# Patient Record
Sex: Female | Born: 1950
Health system: Southern US, Community
[De-identification: ages and names within clinical notes are randomized; demographics above are authoritative.]

## PROBLEM LIST (undated history)

## (undated) DIAGNOSIS — E119 Type 2 diabetes mellitus without complications: Secondary | ICD-10-CM

## (undated) DIAGNOSIS — Z86018 Personal history of other benign neoplasm: Secondary | ICD-10-CM

## (undated) DIAGNOSIS — E785 Hyperlipidemia, unspecified: Secondary | ICD-10-CM

## (undated) DIAGNOSIS — E039 Hypothyroidism, unspecified: Secondary | ICD-10-CM

## (undated) HISTORY — DX: Hyperlipidemia, unspecified: E78.5

## (undated) HISTORY — PX: EYE SURGERY: SHX253

## (undated) HISTORY — PX: COLONOSCOPY: SHX174

## (undated) HISTORY — DX: Personal history of other benign neoplasm: Z86.018

---

## 2005-03-09 ENCOUNTER — Ambulatory Visit: Payer: Self-pay | Admitting: Family Medicine

## 2008-01-04 ENCOUNTER — Ambulatory Visit: Payer: Self-pay | Admitting: Gastroenterology

## 2008-05-25 HISTORY — PX: OTHER SURGICAL HISTORY: SHX169

## 2010-08-22 DIAGNOSIS — Z86018 Personal history of other benign neoplasm: Secondary | ICD-10-CM | POA: Insufficient documentation

## 2010-08-22 HISTORY — DX: Personal history of other benign neoplasm: Z86.018

## 2012-02-02 ENCOUNTER — Ambulatory Visit: Payer: Self-pay | Admitting: Family Medicine

## 2014-03-02 ENCOUNTER — Ambulatory Visit: Payer: Self-pay | Admitting: Family Medicine

## 2015-03-12 ENCOUNTER — Other Ambulatory Visit: Payer: Self-pay | Admitting: Family Medicine

## 2015-03-12 DIAGNOSIS — Z1231 Encounter for screening mammogram for malignant neoplasm of breast: Secondary | ICD-10-CM

## 2015-03-27 ENCOUNTER — Ambulatory Visit
Admission: RE | Admit: 2015-03-27 | Discharge: 2015-03-27 | Disposition: A | Discharge status: Home / Self Care | Payer: BLUE CROSS/BLUE SHIELD | Source: Ambulatory Visit | Attending: Family Medicine | Admitting: Family Medicine

## 2015-03-27 DIAGNOSIS — Z1231 Encounter for screening mammogram for malignant neoplasm of breast: Secondary | ICD-10-CM | POA: Diagnosis present

## 2015-05-08 DIAGNOSIS — E039 Hypothyroidism, unspecified: Secondary | ICD-10-CM | POA: Insufficient documentation

## 2015-05-16 ENCOUNTER — Ambulatory Visit (INDEPENDENT_AMBULATORY_CARE_PROVIDER_SITE_OTHER): Payer: BLUE CROSS/BLUE SHIELD | Admitting: Family Medicine

## 2015-05-16 ENCOUNTER — Encounter: Payer: Self-pay | Admitting: Family Medicine

## 2015-05-16 VITALS — BP 112/70 | HR 60 | Temp 97.8°F | Ht 65.6 in | Wt 215.0 lb

## 2015-05-16 DIAGNOSIS — E039 Hypothyroidism, unspecified: Secondary | ICD-10-CM | POA: Diagnosis not present

## 2015-05-16 DIAGNOSIS — Z0000 Encounter for general adult medical examination without abnormal findings: Secondary | ICD-10-CM

## 2015-05-16 DIAGNOSIS — Z113 Encounter for screening for infections with a predominantly sexual mode of transmission: Secondary | ICD-10-CM

## 2015-05-16 LAB — URINALYSIS, ROUTINE W REFLEX MICROSCOPIC
BILIRUBIN UA: NEGATIVE
Glucose, UA: NEGATIVE
Ketones, UA: NEGATIVE
Leukocytes, UA: NEGATIVE
NITRITE UA: NEGATIVE
PH UA: 7 (ref 5.0–7.5)
Protein, UA: NEGATIVE
RBC UA: NEGATIVE
SPEC GRAV UA: 1.02 (ref 1.005–1.030)
Urobilinogen, Ur: 0.2 mg/dL (ref 0.2–1.0)

## 2015-05-16 MED ORDER — LEVOTHYROXINE SODIUM 50 MCG PO TABS: 50.0000 ug | ORAL_TABLET | Freq: Every day | ORAL | Status: DC

## 2015-05-16 NOTE — Assessment & Plan Note (Signed)
The current medical regimen is effective;  continue present plan and medications.  

## 2015-05-16 NOTE — Progress Notes (Signed)
   BP 112/70 mmHg  Pulse 60  Temp(Src) 97.8 F (36.6 C)  Ht 5' 5.6" (1.666 m)  Wt 215 lb (97.523 kg)  BMI 35.14 kg/m2  SpO2 98%   Subjective:    Patient ID: Sheri Arnold, female    DOB: 1950-07-11, 64 y.o.   MRN: [id number]  HPI: Sheri Arnold is a 64 y.o. female  Chief Complaint  Patient presents with  . Annual Exam    Relevant past medical, surgical, family and social history reviewed and updated as indicated. Interim medical history since our last visit reviewed. Allergies and medications reviewed and updated.  Review of Systems  Constitutional: Negative.   HENT: Negative.   Eyes: Negative.   Respiratory: Negative.   Cardiovascular: Negative.   Gastrointestinal: Negative.   Endocrine: Negative.   Genitourinary: Negative.   Musculoskeletal: Negative.   Skin: Negative.   Allergic/Immunologic: Negative.   Neurological: Negative.   Hematological: Negative.   Psychiatric/Behavioral: Negative.     Per HPI unless specifically indicated above     Objective:    BP 112/70 mmHg  Pulse 60  Temp(Src) 97.8 F (36.6 C)  Ht 5' 5.6" (1.666 m)  Wt 215 lb (97.523 kg)  BMI 35.14 kg/m2  SpO2 98%  Wt Readings from Last 3 Encounters:  05/16/15 215 lb (97.523 kg)  05/09/14 208 lb (94.348 kg)    Physical Exam  Constitutional: She is oriented to person, place, and time. She appears well-developed and well-nourished.  HENT:  Head: Normocephalic and atraumatic.  Right Ear: External ear normal.  Left Ear: External ear normal.  Nose: Nose normal.  Mouth/Throat: Oropharynx is clear and moist.  Eyes: Conjunctivae and EOM are normal. Pupils are equal, round, and reactive to light.  Neck: Normal range of motion. Neck supple. Carotid bruit is not present.  Cardiovascular: Normal rate, regular rhythm and normal heart sounds.   No murmur heard. Pulmonary/Chest: Effort normal and breath sounds normal. She exhibits no mass. Right breast exhibits no mass, no skin change  and no tenderness. Left breast exhibits no mass, no skin change and no tenderness. Breasts are symmetrical.  Abdominal: Soft. Bowel sounds are normal. There is no hepatosplenomegaly.  Musculoskeletal: Normal range of motion.  Neurological: She is alert and oriented to person, place, and time.  Skin: No rash noted.  Psychiatric: She has a normal mood and affect. Her behavior is normal. Judgment and thought content normal.    No results found for this or any previous visit.    Assessment & Plan:   Problem List Items Addressed This Visit      Endocrine   Hypothyroidism    The current medical regimen is effective;  continue present plan and medications.       Relevant Medications   levothyroxine (SYNTHROID, LEVOTHROID) 50 MCG tablet    Other Visit Diagnoses    Routine general medical examination at a health care facility    -  Primary    Relevant Orders    CBC with Differential/Platelet    Comprehensive metabolic panel    Lipid Panel w/o Chol/HDL Ratio    TSH    Urinalysis, Routine w reflex microscopic (not at [facility])    Routine screening for STI (sexually transmitted infection)        Relevant Orders    Hepatitis C Antibody        Follow up plan: Return in about 1 year (around 05/15/2016) for Physical Exam.

## 2015-05-17 ENCOUNTER — Encounter: Payer: Self-pay | Admitting: Family Medicine

## 2015-05-17 ENCOUNTER — Telehealth: Payer: Self-pay | Admitting: Family Medicine

## 2015-05-17 LAB — COMPREHENSIVE METABOLIC PANEL
A/G RATIO: 1.4 (ref 1.1–2.5)
ALBUMIN: 4.2 g/dL (ref 3.6–4.8)
ALT: 31 IU/L (ref 0–32)
AST: 20 IU/L (ref 0–40)
Alkaline Phosphatase: 86 IU/L (ref 39–117)
BUN / CREAT RATIO: 21 (ref 11–26)
BUN: 13 mg/dL (ref 8–27)
Bilirubin Total: 0.2 mg/dL (ref 0.0–1.2)
CO2: 23 mmol/L (ref 18–29)
Calcium: 9.7 mg/dL (ref 8.7–10.3)
Chloride: 100 mmol/L (ref 96–106)
Creatinine, Ser: 0.61 mg/dL (ref 0.57–1.00)
GFR calc Af Amer: 112 mL/min/{1.73_m2} (ref >59)
GFR, EST NON AFRICAN AMERICAN: 97 mL/min/{1.73_m2} (ref >59)
GLOBULIN, TOTAL: 3 g/dL (ref 1.5–4.5)
Glucose: 105 mg/dL — ABNORMAL HIGH (ref 65–99)
POTASSIUM: 4.5 mmol/L (ref 3.5–5.2)
SODIUM: 139 mmol/L (ref 134–144)
Total Protein: 7.2 g/dL (ref 6.0–8.5)

## 2015-05-17 LAB — CBC WITH DIFFERENTIAL/PLATELET
BASOS: 1 %
Basophils Absolute: 0 10*3/uL (ref 0.0–0.2)
EOS (ABSOLUTE): 0.1 10*3/uL (ref 0.0–0.4)
Eos: 2 %
HEMATOCRIT: 39.8 % (ref 34.0–46.6)
Hemoglobin: 13.1 g/dL (ref 11.1–15.9)
Immature Grans (Abs): 0 10*3/uL (ref 0.0–0.1)
Immature Granulocytes: 0 %
LYMPHS ABS: 1.4 10*3/uL (ref 0.7–3.1)
Lymphs: 24 %
MCH: 29.2 pg (ref 26.6–33.0)
MCHC: 32.9 g/dL (ref 31.5–35.7)
MCV: 89 fL (ref 79–97)
MONOS ABS: 0.5 10*3/uL (ref 0.1–0.9)
Monocytes: 9 %
NEUTROS PCT: 64 %
Neutrophils Absolute: 3.8 10*3/uL (ref 1.4–7.0)
Platelets: 283 10*3/uL (ref 150–379)
RBC: 4.49 x10E6/uL (ref 3.77–5.28)
RDW: 14 % (ref 12.3–15.4)
WBC: 5.8 10*3/uL (ref 3.4–10.8)

## 2015-05-17 LAB — LIPID PANEL W/O CHOL/HDL RATIO
CHOLESTEROL TOTAL: 288 mg/dL — AB (ref 100–199)
HDL: 66 mg/dL (ref >39)
LDL Calculated: 178 mg/dL — ABNORMAL HIGH (ref 0–99)
TRIGLYCERIDES: 221 mg/dL — AB (ref 0–149)
VLDL Cholesterol Cal: 44 mg/dL — ABNORMAL HIGH (ref 5–40)

## 2015-05-17 LAB — TSH: TSH: 2.04 u[IU]/mL (ref 0.450–4.500)

## 2015-05-17 LAB — HEPATITIS C ANTIBODY

## 2015-05-17 NOTE — Telephone Encounter (Signed)
-----   Message from Wynn Maudlin, [place] sent at 05/17/2015 11:47 AM EST ----- labs

## 2015-05-17 NOTE — Telephone Encounter (Signed)
Phone call Discussed with patient elevated cholesterol patient has tried cholesterol medications and has marked side effects myalgias doesn't want to take any cholesterol medication has had a coronary calcium score with a score of 0. Continue management with diet and exercise.

## 2015-05-22 ENCOUNTER — Other Ambulatory Visit: Payer: Self-pay | Admitting: Family Medicine

## 2015-05-22 NOTE — Telephone Encounter (Signed)
360 tabs given to patient 12/22 to [facility]- does she really need this?

## 2015-08-19 ENCOUNTER — Other Ambulatory Visit: Payer: Self-pay | Admitting: Family Medicine

## 2015-12-13 ENCOUNTER — Telehealth: Payer: Self-pay | Admitting: Family Medicine

## 2015-12-13 DIAGNOSIS — E039 Hypothyroidism, unspecified: Secondary | ICD-10-CM

## 2015-12-13 NOTE — Telephone Encounter (Signed)
Pt called and stated that she has gained weight and is losing hair and she is afraid something is going on with her thyroid and she would like to get a referral to go to endocrinology.

## 2015-12-13 NOTE — Telephone Encounter (Signed)
Referral sent 

## 2015-12-13 NOTE — Telephone Encounter (Signed)
Routing to provider  

## 2016-04-03 ENCOUNTER — Encounter: Payer: Self-pay | Admitting: Family Medicine

## 2016-05-20 ENCOUNTER — Encounter: Payer: BLUE CROSS/BLUE SHIELD | Admitting: Family Medicine

## 2016-05-23 ENCOUNTER — Other Ambulatory Visit: Payer: Self-pay | Admitting: Family Medicine

## 2016-05-23 DIAGNOSIS — E039 Hypothyroidism, unspecified: Secondary | ICD-10-CM

## 2016-05-26 NOTE — Telephone Encounter (Signed)
Last OV: 05/16/15 Next OV: 06/01/16  Lab Results  Component Value Date   TSH 2.040 05/16/2015

## 2016-06-01 ENCOUNTER — Other Ambulatory Visit: Payer: Self-pay | Admitting: Family Medicine

## 2016-06-01 ENCOUNTER — Ambulatory Visit (INDEPENDENT_AMBULATORY_CARE_PROVIDER_SITE_OTHER): Payer: BLUE CROSS/BLUE SHIELD | Admitting: Family Medicine

## 2016-06-01 ENCOUNTER — Encounter: Payer: Self-pay | Admitting: Family Medicine

## 2016-06-01 VITALS — BP 114/70 | HR 66 | Temp 97.8°F | Ht 65.35 in | Wt 208.0 lb

## 2016-06-01 DIAGNOSIS — Z1231 Encounter for screening mammogram for malignant neoplasm of breast: Secondary | ICD-10-CM

## 2016-06-01 DIAGNOSIS — Z0000 Encounter for general adult medical examination without abnormal findings: Secondary | ICD-10-CM

## 2016-06-01 DIAGNOSIS — E039 Hypothyroidism, unspecified: Secondary | ICD-10-CM | POA: Diagnosis not present

## 2016-06-01 LAB — URINALYSIS, ROUTINE W REFLEX MICROSCOPIC
BILIRUBIN UA: NEGATIVE
Glucose, UA: NEGATIVE
KETONES UA: NEGATIVE
NITRITE UA: NEGATIVE
PH UA: 7 (ref 5.0–7.5)
Protein, UA: NEGATIVE
RBC UA: NEGATIVE
SPEC GRAV UA: 1.015 (ref 1.005–1.030)
UUROB: 0.2 mg/dL (ref 0.2–1.0)

## 2016-06-01 LAB — MICROSCOPIC EXAMINATION
Bacteria, UA: NONE SEEN
Epithelial Cells (non renal): NONE SEEN /hpf (ref 0–10)
RBC, UA: NONE SEEN /hpf (ref 0–?)
WBC, UA: NONE SEEN /hpf (ref 0–?)

## 2016-06-01 MED ORDER — METFORMIN HCL ER 500 MG PO TB24: 500.0000 mg | ORAL_TABLET | Freq: Every day | ORAL | 4 refills | Status: DC

## 2016-06-01 MED ORDER — SYNTHROID 50 MCG PO TABS: 50.0000 ug | ORAL_TABLET | Freq: Every day | ORAL | 4 refills | Status: DC

## 2016-06-01 NOTE — Assessment & Plan Note (Signed)
The current medical regimen is effective;  continue present plan and medications.  

## 2016-06-01 NOTE — Progress Notes (Signed)
BP 114/70   Pulse 66   Temp 97.8 F (36.6 C) (Oral)   Ht 5' 5.35" (1.66 m)   Wt 208 lb (94.3 kg)   SpO2 99%   BMI 34.24 kg/m    Subjective:    Patient ID: Sheri Arnold, female    DOB: 04/15/1951, 66 y.o.   MRN: [id number]  HPI: Sheri Arnold is a 66 y.o. female  Chief Complaint  Patient presents with  . Annual Exam  Patient's interested in metformin. No blood sugar issues but interested in some positive effects may form and may generate. Patient wants to avoid immediate release and go to extended release to minimize GI toxicity. Patient also has been using betamethasone for some itching spot on her foot which hasn't really helped and some little patch on her left elbow. Takes thyroid without issues. 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  Relevant past medical, surgical, family and social history reviewed and updated as indicated. Interim medical history since our last visit reviewed. Allergies and medications reviewed and updated.  Review of Systems  Constitutional: Negative.   HENT: Negative.   Eyes: Negative.   Respiratory: Negative.   Cardiovascular: Negative.   Gastrointestinal: Negative.   Endocrine: Negative.   Genitourinary: Negative.   Musculoskeletal: Negative.   Skin: Negative.   Allergic/Immunologic: Negative.   Neurological: Negative.   Hematological: Negative.   Psychiatric/Behavioral: Negative.     Per HPI unless specifically indicated above     Objective:    BP 114/70   Pulse 66   Temp 97.8 F (36.6 C) (Oral)   Ht 5' 5.35" (1.66 m)   Wt 208 lb (94.3 kg)   SpO2 99%   BMI 34.24 kg/m   Wt Readings from Last 3 Encounters:  06/01/16 208 lb (94.3 kg)  05/16/15 215 lb (97.5 kg)  05/09/14 208 lb (94.3 kg)    Physical Exam  Constitutional: She is oriented to person, place, and time. She appears well-developed and well-nourished.  HENT:  Head: Normocephalic  and atraumatic.  Right Ear: External ear normal.  Left Ear: External ear normal.  Nose: Nose normal.  Mouth/Throat: Oropharynx is clear and moist.  Eyes: Conjunctivae and EOM are normal. Pupils are equal, round, and reactive to light.  Neck: Normal range of motion. Neck supple. Carotid bruit is not present.  Cardiovascular: Normal rate, regular rhythm and normal heart sounds.   No murmur heard. Pulmonary/Chest: Effort normal and breath sounds normal. She exhibits no mass. Right breast exhibits no mass, no skin change and no tenderness. Left breast exhibits no mass, no skin change and no tenderness. Breasts are symmetrical.  Abdominal: Soft. Bowel sounds are normal. There is no hepatosplenomegaly.  Musculoskeletal: Normal range of motion.  Neurological: She is alert and oriented to person, place, and time.  Skin: No rash noted.  Psychiatric: She has a normal mood and affect. Her behavior is normal. Judgment and thought content normal.    Results for orders placed or performed in visit on 05/16/15  CBC with Differential/Platelet  Result Value Ref Range   WBC 5.8 3.4 - 10.8 x10E3/uL   RBC 4.49 3.77 - 5.28 x10E6/uL   Hemoglobin 13.1 11.1 - 15.9 g/dL   Hematocrit 39.8 34.0 - 46.6 %   MCV 89 79 - 97 fL   MCH 29.2 26.6 - 33.0 pg   MCHC 32.9 31.5 - 35.7 g/dL   RDW 14.0  12.3 - 15.4 %   Platelets 283 150 - 379 x10E3/uL   Neutrophils 64 %   Lymphs 24 %   Monocytes 9 %   Eos 2 %   Basos 1 %   Neutrophils Absolute 3.8 1.4 - 7.0 x10E3/uL   Lymphocytes Absolute 1.4 0.7 - 3.1 x10E3/uL   Monocytes Absolute 0.5 0.1 - 0.9 x10E3/uL   EOS (ABSOLUTE) 0.1 0.0 - 0.4 x10E3/uL   Basophils Absolute 0.0 0.0 - 0.2 x10E3/uL   Immature Granulocytes 0 %   Immature Grans (Abs) 0.0 0.0 - 0.1 x10E3/uL  Comprehensive metabolic panel  Result Value Ref Range   Glucose 105 (H) 65 - 99 mg/dL   BUN 13 8 - 27 mg/dL   Creatinine, Ser 0.61 0.57 - 1.00 mg/dL   GFR calc non Af Amer 97 >59 mL/min/1.73   GFR calc Af  Amer 112 >59 mL/min/1.73   BUN/Creatinine Ratio 21 11 - 26   Sodium 139 134 - 144 mmol/L   Potassium 4.5 3.5 - 5.2 mmol/L   Chloride 100 96 - 106 mmol/L   CO2 23 18 - 29 mmol/L   Calcium 9.7 8.7 - 10.3 mg/dL   Total Protein 7.2 6.0 - 8.5 g/dL   Albumin 4.2 3.6 - 4.8 g/dL   Globulin, Total 3.0 1.5 - 4.5 g/dL   Albumin/Globulin Ratio 1.4 1.1 - 2.5   Bilirubin Total 0.2 0.0 - 1.2 mg/dL   Alkaline Phosphatase 86 39 - 117 IU/L   AST 20 0 - 40 IU/L   ALT 31 0 - 32 IU/L  Lipid Panel w/o Chol/HDL Ratio  Result Value Ref Range   Cholesterol, Total 288 (H) 100 - 199 mg/dL   Triglycerides 221 (H) 0 - 149 mg/dL   HDL 66 >39 mg/dL   VLDL Cholesterol Cal 44 (H) 5 - 40 mg/dL   LDL Calculated 178 (H) 0 - 99 mg/dL  TSH  Result Value Ref Range   TSH 2.040 0.450 - 4.500 uIU/mL  Urinalysis, Routine w reflex microscopic (not at [facility])  Result Value Ref Range   Specific Gravity, UA 1.020 1.005 - 1.030   pH, UA 7.0 5.0 - 7.5   Color, UA Yellow Yellow   Appearance Ur Clear Clear   Leukocytes, UA Negative Negative   Protein, UA Negative Negative/Trace   Glucose, UA Negative Negative   Ketones, UA Negative Negative   RBC, UA Negative Negative   Bilirubin, UA Negative Negative   Urobilinogen, Ur 0.2 0.2 - 1.0 mg/dL   Nitrite, UA Negative Negative  Hepatitis C Antibody  Result Value Ref Range   Hep C Virus Ab <0.1 0.0 - 0.9 s/co ratio      Assessment & Plan:   Problem List Items Addressed This Visit      Endocrine   Hypothyroidism - Primary    The current medical regimen is effective;  continue present plan and medications.       Relevant Medications   SYNTHROID 50 MCG tablet   Other Relevant Orders   TSH    Other Visit Diagnoses    PE (physical exam), annual       Relevant Orders   CBC With Differential   TSH   Lipid panel   Comprehensive metabolic panel   Urinalysis, Routine w reflex microscopic     Patient refuses HIV and Pap smear  Follow up plan: Return in about 1 year  (around 06/01/2017) for Physical Exam.

## 2016-06-02 LAB — LIPID PANEL
CHOL/HDL RATIO: 4.5 ratio — AB (ref 0.0–4.4)
CHOLESTEROL TOTAL: 289 mg/dL — AB (ref 100–199)
HDL: 64 mg/dL (ref >39)
LDL Calculated: 172 mg/dL — ABNORMAL HIGH (ref 0–99)
TRIGLYCERIDES: 266 mg/dL — AB (ref 0–149)
VLDL Cholesterol Cal: 53 mg/dL — ABNORMAL HIGH (ref 5–40)

## 2016-06-02 LAB — CBC WITH DIFFERENTIAL/PLATELET
BASOS: 1 %
Basophils Absolute: 0 10*3/uL (ref 0.0–0.2)
EOS (ABSOLUTE): 0.1 10*3/uL (ref 0.0–0.4)
EOS: 1 %
HEMATOCRIT: 40.7 % (ref 34.0–46.6)
HEMOGLOBIN: 13.5 g/dL (ref 11.1–15.9)
Immature Grans (Abs): 0 10*3/uL (ref 0.0–0.1)
Immature Granulocytes: 0 %
LYMPHS ABS: 1.4 10*3/uL (ref 0.7–3.1)
Lymphs: 20 %
MCH: 29.3 pg (ref 26.6–33.0)
MCHC: 33.2 g/dL (ref 31.5–35.7)
MCV: 88 fL (ref 79–97)
MONOCYTES: 10 %
Monocytes Absolute: 0.7 10*3/uL (ref 0.1–0.9)
NEUTROS ABS: 5.1 10*3/uL (ref 1.4–7.0)
Neutrophils: 68 %
Platelets: 322 10*3/uL (ref 150–379)
RBC: 4.61 x10E6/uL (ref 3.77–5.28)
RDW: 13.7 % (ref 12.3–15.4)
WBC: 7.4 10*3/uL (ref 3.4–10.8)

## 2016-06-02 LAB — COMPREHENSIVE METABOLIC PANEL
A/G RATIO: 1.5 (ref 1.2–2.2)
ALT: 22 IU/L (ref 0–32)
AST: 24 IU/L (ref 0–40)
Albumin: 4.3 g/dL (ref 3.6–4.8)
Alkaline Phosphatase: 89 IU/L (ref 39–117)
BUN/Creatinine Ratio: 29 — ABNORMAL HIGH (ref 12–28)
BUN: 20 mg/dL (ref 8–27)
Bilirubin Total: <0.2 mg/dL (ref 0.0–1.2)
CALCIUM: 10 mg/dL (ref 8.7–10.3)
CO2: 20 mmol/L (ref 18–29)
CREATININE: 0.7 mg/dL (ref 0.57–1.00)
Chloride: 99 mmol/L (ref 96–106)
GFR, EST AFRICAN AMERICAN: 107 mL/min/{1.73_m2} (ref >59)
GFR, EST NON AFRICAN AMERICAN: 93 mL/min/{1.73_m2} (ref >59)
Globulin, Total: 2.9 g/dL (ref 1.5–4.5)
Glucose: 93 mg/dL (ref 65–99)
POTASSIUM: 5.1 mmol/L (ref 3.5–5.2)
Sodium: 142 mmol/L (ref 134–144)
TOTAL PROTEIN: 7.2 g/dL (ref 6.0–8.5)

## 2016-06-02 LAB — TSH: TSH: 2.48 u[IU]/mL (ref 0.450–4.500)

## 2016-06-22 ENCOUNTER — Other Ambulatory Visit: Payer: Self-pay | Admitting: Family Medicine

## 2016-06-22 NOTE — Telephone Encounter (Signed)
Your patient 

## 2016-06-29 ENCOUNTER — Ambulatory Visit
Admission: RE | Admit: 2016-06-29 | Discharge: 2016-06-29 | Disposition: A | Discharge status: Home / Self Care | Payer: BLUE CROSS/BLUE SHIELD | Source: Ambulatory Visit | Attending: Family Medicine | Admitting: Family Medicine

## 2016-06-29 DIAGNOSIS — Z1231 Encounter for screening mammogram for malignant neoplasm of breast: Secondary | ICD-10-CM | POA: Insufficient documentation

## 2016-11-11 ENCOUNTER — Encounter: Payer: Self-pay | Admitting: Family Medicine

## 2016-11-11 ENCOUNTER — Ambulatory Visit (INDEPENDENT_AMBULATORY_CARE_PROVIDER_SITE_OTHER): Payer: PRIVATE HEALTH INSURANCE | Admitting: Family Medicine

## 2016-11-11 VITALS — BP 125/77 | HR 85 | Temp 98.8°F | Wt 209.0 lb

## 2016-11-11 DIAGNOSIS — H6592 Unspecified nonsuppurative otitis media, left ear: Secondary | ICD-10-CM | POA: Diagnosis not present

## 2016-11-11 MED ORDER — FLUTICASONE PROPIONATE 50 MCG/ACT NA SUSP: 2.0000 | Freq: Every day | NASAL | 6 refills | Status: DC

## 2016-11-11 MED ORDER — CETIRIZINE HCL 10 MG PO TABS: 10.0000 mg | ORAL_TABLET | Freq: Every day | ORAL | 11 refills | Status: DC

## 2016-11-11 MED ORDER — PSEUDOEPHEDRINE HCL 30 MG PO TABS: 30.0000 mg | ORAL_TABLET | ORAL | 0 refills | Status: DC | PRN

## 2016-11-11 NOTE — Progress Notes (Signed)
   BP 125/77   Pulse 85   Temp 98.8 F (37.1 C)   Wt 209 lb (94.8 kg)   SpO2 95%   BMI 34.40 kg/m    Subjective:    Patient ID: Sheri Arnold, female    DOB: 12-08-1950, 66 y.o.   MRN: [id number]  HPI: Sheri Arnold is a 66 y.o. female  Chief Complaint  Patient presents with  . Otitis Externa    right ear x 1 week. Jaw hurts.    Right jaw pain x 1 week that radiates up to right ear. Went to dentist, no tooth abnormalities on exam. Long hx of ear infections and traveling to [place] next week so wanted to get things checked out. Using hyland ear drops with some relief, and tried a few zyrtec but they weren't helping so she stopped. Denies fever, congestion, sore throat, cough, drainage from ear.   Relevant past medical, surgical, family and social history reviewed and updated as indicated. Interim medical history since our last visit reviewed. Allergies and medications reviewed and updated.  Review of Systems  Constitutional: Negative.   HENT: Positive for ear pain and hearing loss (muffled). Negative for congestion, rhinorrhea and sore throat.   Respiratory: Negative.   Cardiovascular: Negative.   Gastrointestinal: Negative.   Genitourinary: Negative.   Musculoskeletal: Negative.   Neurological: Negative.   Psychiatric/Behavioral: Negative.    Per HPI unless specifically indicated above     Objective:    BP 125/77   Pulse 85   Temp 98.8 F (37.1 C)   Wt 209 lb (94.8 kg)   SpO2 95%   BMI 34.40 kg/m   Wt Readings from Last 3 Encounters:  11/11/16 209 lb (94.8 kg)  06/01/16 208 lb (94.3 kg)  05/16/15 215 lb (97.5 kg)    Physical Exam  Constitutional: She is oriented to person, place, and time. She appears well-developed and well-nourished. No distress.  HENT:  Head: Atraumatic.  Right Ear: External ear normal.  Left Ear: External ear normal.  Nose: Nose normal.  Mouth/Throat: No oropharyngeal exudate.  Right middle ear effusion, no erythema or  edema  Eyes: Conjunctivae are normal. Pupils are equal, round, and reactive to light. No scleral icterus.  Neck: Normal range of motion. Neck supple.  Cardiovascular: Normal rate and normal heart sounds.   Pulmonary/Chest: Effort normal and breath sounds normal. No respiratory distress.  Musculoskeletal: Normal range of motion.  Neurological: She is alert and oriented to person, place, and time. No cranial nerve deficit.  Skin: Skin is warm and dry.  Psychiatric: She has a normal mood and affect. Her behavior is normal.  Nursing note and vitals reviewed.     Assessment & Plan:   Problem List Items Addressed This Visit    None    Visit Diagnoses    Otitis media with effusion, left    -  Primary   Zyrtec, sudafed, and flonase sent. Discussed sxs to watch for to determine acute infection. Has some augmentin at home in case sxs worsen as discussed       Follow up plan: Return for as scheduled.

## 2016-11-15 ENCOUNTER — Encounter: Payer: Self-pay | Admitting: Family Medicine

## 2017-02-12 ENCOUNTER — Telehealth: Payer: Self-pay | Admitting: Family Medicine

## 2017-02-12 NOTE — Telephone Encounter (Signed)
Entered in error

## 2017-02-12 NOTE — Telephone Encounter (Signed)
Patient will be dropping off her paperwork for her job. Patient needing paperwork signed in regards to her having a recent physical for this year as well as a copy of her shot records.  Patient scheduled to get her flu vaccine and TB blood draw 02/16/2017.  Please Advise.  Thank you

## 2017-02-16 ENCOUNTER — Ambulatory Visit: Payer: PRIVATE HEALTH INSURANCE | Admitting: Family Medicine

## 2017-02-16 ENCOUNTER — Ambulatory Visit (INDEPENDENT_AMBULATORY_CARE_PROVIDER_SITE_OTHER): Payer: PRIVATE HEALTH INSURANCE

## 2017-02-16 ENCOUNTER — Other Ambulatory Visit: Payer: PRIVATE HEALTH INSURANCE

## 2017-02-16 DIAGNOSIS — Z23 Encounter for immunization: Secondary | ICD-10-CM

## 2017-02-16 DIAGNOSIS — Z111 Encounter for screening for respiratory tuberculosis: Secondary | ICD-10-CM

## 2017-02-16 NOTE — Telephone Encounter (Signed)
Pt is also going to need a Tdap. Advised pt to please come in to pick up form to have injection done then. Form is at my desk, will await TB lab draw results before I call patient back in.

## 2017-02-16 NOTE — Telephone Encounter (Signed)
Patient dropped off paperwork. Paperwork was handed to provider [employer].   Patient scheduled for visit 02/16/2017.

## 2017-02-16 NOTE — Telephone Encounter (Signed)
She would like her form to be mailed to her once we get the lab results back.

## 2017-02-18 LAB — QUANTIFERON TB GOLD ASSAY (BLOOD)

## 2017-02-23 ENCOUNTER — Ambulatory Visit (INDEPENDENT_AMBULATORY_CARE_PROVIDER_SITE_OTHER): Payer: PRIVATE HEALTH INSURANCE

## 2017-02-23 DIAGNOSIS — Z23 Encounter for immunization: Secondary | ICD-10-CM | POA: Diagnosis not present

## 2017-02-24 LAB — QUANTIFERON-TB GOLD PLUS
QUANTIFERON NIL VALUE: 0.04 [IU]/mL
QUANTIFERON TB2 AG VALUE: 0.03 [IU]/mL
QuantiFERON TB1 Ag Value: 0.03 IU/mL
QuantiFERON-TB Gold Plus: NEGATIVE

## 2017-02-24 LAB — TEST CODE CHANGE

## 2017-06-03 ENCOUNTER — Ambulatory Visit (INDEPENDENT_AMBULATORY_CARE_PROVIDER_SITE_OTHER): Payer: PRIVATE HEALTH INSURANCE | Admitting: Family Medicine

## 2017-06-03 ENCOUNTER — Encounter: Payer: Self-pay | Admitting: Family Medicine

## 2017-06-03 VITALS — BP 127/78 | HR 68 | Ht 66.0 in | Wt 208.0 lb

## 2017-06-03 DIAGNOSIS — E782 Mixed hyperlipidemia: Secondary | ICD-10-CM

## 2017-06-03 DIAGNOSIS — Z131 Encounter for screening for diabetes mellitus: Secondary | ICD-10-CM | POA: Diagnosis not present

## 2017-06-03 DIAGNOSIS — Z13220 Encounter for screening for lipoid disorders: Secondary | ICD-10-CM

## 2017-06-03 DIAGNOSIS — E039 Hypothyroidism, unspecified: Secondary | ICD-10-CM | POA: Diagnosis not present

## 2017-06-03 DIAGNOSIS — E785 Hyperlipidemia, unspecified: Secondary | ICD-10-CM | POA: Insufficient documentation

## 2017-06-03 LAB — MICROSCOPIC EXAMINATION

## 2017-06-03 LAB — URINALYSIS, ROUTINE W REFLEX MICROSCOPIC
Bilirubin, UA: NEGATIVE
GLUCOSE, UA: NEGATIVE
KETONES UA: NEGATIVE
Leukocytes, UA: NEGATIVE
NITRITE UA: NEGATIVE
PROTEIN UA: NEGATIVE
SPEC GRAV UA: 1.02 (ref 1.005–1.030)
UUROB: 0.2 mg/dL (ref 0.2–1.0)
pH, UA: 7 (ref 5.0–7.5)

## 2017-06-03 MED ORDER — SYNTHROID 50 MCG PO TABS: 50.0000 ug | ORAL_TABLET | Freq: Every day | ORAL | 4 refills | Status: DC

## 2017-06-03 MED ORDER — BETAMETHASONE VALERATE 0.1 % EX CREA: TOPICAL_CREAM | Freq: Every day | CUTANEOUS | 1 refills | Status: DC

## 2017-06-03 NOTE — Assessment & Plan Note (Signed)
Discuss elevated cholesterol in the past will hope it's improved with less stress and discuss medication if needed.

## 2017-06-03 NOTE — Assessment & Plan Note (Signed)
The current medical regimen is effective;  continue present plan and medications.  

## 2017-06-03 NOTE — Progress Notes (Signed)
BP 127/78   Pulse 68   Ht 5\' 6"  (1.676 m)   Wt 208 lb (94.3 kg)   SpO2 98%   BMI 33.57 kg/m    Subjective:    Patient ID: Sheri Arnold, female    DOB: 12/15/1950, 67 y.o.   MRN: [id number]  HPI: Gaynel Schaafsma is a 67 y.o. female Patient all in all doing well no complaints taking thyroid without problems needs betamethasone her eczema that flares in the winter.Has very limited insurance will be getting Medicare this fall. Requesting physical be done this fall instead of right now.   Relevant past medical, surgical, family and social history reviewed and updated as indicated. Interim medical history since our last visit reviewed. Allergies and medications reviewed and updated.  Review of Systems  Constitutional: Negative.   Respiratory: Negative.   Cardiovascular: Negative.     Per HPI unless specifically indicated above     Objective:    BP 127/78   Pulse 68   Ht 5\' 6"  (1.676 m)   Wt 208 lb (94.3 kg)   SpO2 98%   BMI 33.57 kg/m   Wt Readings from Last 3 Encounters:  06/03/17 208 lb (94.3 kg)  11/11/16 209 lb (94.8 kg)  06/01/16 208 lb (94.3 kg)    Physical Exam  Constitutional: She is oriented to person, place, and time. She appears well-developed and well-nourished.  HENT:  Head: Normocephalic and atraumatic.  Eyes: Conjunctivae and EOM are normal.  Neck: Normal range of motion.  Cardiovascular: Normal rate, regular rhythm and normal heart sounds.  Pulmonary/Chest: Effort normal and breath sounds normal.  Musculoskeletal: Normal range of motion.  Neurological: She is alert and oriented to person, place, and time.  Skin: No erythema.  Psychiatric: She has a normal mood and affect. Her behavior is normal. Judgment and thought content normal.    Results for orders placed or performed in visit on 02/16/17  Quantiferon tb gold assay (blood)  Result Value Ref Range   QUANTIFERON INCUBATION WILL FOLLOW    QUANTIFERON TB GOLD WILL FOLLOW    QUANTIFERON CRITERIA WILL FOLLOW    QUANTIFERON TB AG VALUE WILL FOLLOW    Quantiferon Nil Value WILL FOLLOW    QUANTIFERON MITOGEN VALUE WILL FOLLOW    QFT TB AG MINUS NIL VALUE WILL FOLLOW    Interpretation: WILL FOLLOW   QuantiFERON-TB Gold Plus  Result Value Ref Range   QuantiFERON Incubation Comment    QuantiFERON Criteria Comment    QuantiFERON TB1 Ag Value 0.03 IU/mL   QuantiFERON TB2 Ag Value 0.03 IU/mL   QuantiFERON Nil Value 0.04 IU/mL   QuantiFERON Mitogen Value >10.00 IU/mL   QuantiFERON-TB Gold Plus Negative Negative  TEST CODE CHANGE  Result Value Ref Range   Test Code Change Comment       Assessment & Plan:   Problem List Items Addressed This Visit      Endocrine   Hypothyroidism - Primary    The current medical regimen is effective;  continue present plan and medications.       Relevant Orders   TSH     Other   Hyperlipidemia    Discuss elevated cholesterol in the past will hope it's improved with less stress and discuss medication if needed.       Other Visit Diagnoses    Screening cholesterol level       Relevant Orders   Lipid panel   Screening for diabetes mellitus (DM)  Relevant Orders   CBC with Differential/Platelet   Comprehensive metabolic panel   Urinalysis, Routine w reflex microscopic     discussed holding off on physical for now until becomes Medicare  Follow up plan: Return for Physical Exam, Sept.

## 2017-06-04 LAB — CBC WITH DIFFERENTIAL/PLATELET
BASOS: 0 %
Basophils Absolute: 0 10*3/uL (ref 0.0–0.2)
EOS (ABSOLUTE): 0.1 10*3/uL (ref 0.0–0.4)
EOS: 1 %
Hematocrit: 41.9 % (ref 34.0–46.6)
Hemoglobin: 13.6 g/dL (ref 11.1–15.9)
IMMATURE GRANS (ABS): 0 10*3/uL (ref 0.0–0.1)
IMMATURE GRANULOCYTES: 0 %
LYMPHS: 20 %
Lymphocytes Absolute: 1.5 10*3/uL (ref 0.7–3.1)
MCH: 29.9 pg (ref 26.6–33.0)
MCHC: 32.5 g/dL (ref 31.5–35.7)
MCV: 92 fL (ref 79–97)
Monocytes Absolute: 0.6 10*3/uL (ref 0.1–0.9)
Monocytes: 8 %
NEUTROS PCT: 71 %
Neutrophils Absolute: 5.5 10*3/uL (ref 1.4–7.0)
PLATELETS: 322 10*3/uL (ref 150–379)
RBC: 4.55 x10E6/uL (ref 3.77–5.28)
RDW: 14.4 % (ref 12.3–15.4)
WBC: 7.7 10*3/uL (ref 3.4–10.8)

## 2017-06-04 LAB — TSH: TSH: 1.36 u[IU]/mL (ref 0.450–4.500)

## 2017-06-04 LAB — COMPREHENSIVE METABOLIC PANEL
A/G RATIO: 1.5 (ref 1.2–2.2)
ALT: 21 IU/L (ref 0–32)
AST: 14 IU/L (ref 0–40)
Albumin: 4.5 g/dL (ref 3.6–4.8)
Alkaline Phosphatase: 85 IU/L (ref 39–117)
BUN/Creatinine Ratio: 32 — ABNORMAL HIGH (ref 12–28)
BUN: 22 mg/dL (ref 8–27)
Bilirubin Total: <0.2 mg/dL (ref 0.0–1.2)
CALCIUM: 9.9 mg/dL (ref 8.7–10.3)
CO2: 23 mmol/L (ref 20–29)
CREATININE: 0.68 mg/dL (ref 0.57–1.00)
Chloride: 101 mmol/L (ref 96–106)
GFR, EST AFRICAN AMERICAN: 107 mL/min/{1.73_m2} (ref >59)
GFR, EST NON AFRICAN AMERICAN: 93 mL/min/{1.73_m2} (ref >59)
Globulin, Total: 3 g/dL (ref 1.5–4.5)
Glucose: 94 mg/dL (ref 65–99)
POTASSIUM: 4.7 mmol/L (ref 3.5–5.2)
Sodium: 140 mmol/L (ref 134–144)
TOTAL PROTEIN: 7.5 g/dL (ref 6.0–8.5)

## 2017-06-04 LAB — LIPID PANEL
CHOL/HDL RATIO: 4.2 ratio (ref 0.0–4.4)
Cholesterol, Total: 300 mg/dL — ABNORMAL HIGH (ref 100–199)
HDL: 71 mg/dL (ref >39)
LDL CALC: 179 mg/dL — AB (ref 0–99)
TRIGLYCERIDES: 250 mg/dL — AB (ref 0–149)
VLDL CHOLESTEROL CAL: 50 mg/dL — AB (ref 5–40)

## 2017-06-07 ENCOUNTER — Telehealth: Payer: Self-pay | Admitting: Family Medicine

## 2017-06-07 ENCOUNTER — Encounter: Payer: Self-pay | Admitting: Family Medicine

## 2017-06-07 MED ORDER — ATORVASTATIN CALCIUM 40 MG PO TABS: 40.0000 mg | ORAL_TABLET | Freq: Every day | ORAL | 5 refills | Status: DC

## 2017-06-07 NOTE — Telephone Encounter (Signed)
Phone call Discussed with patient elevated cholesterol ll start Lipitorwill need torecheck in couple of months to assess control.

## 2017-06-08 ENCOUNTER — Encounter: Payer: Self-pay | Admitting: Family Medicine

## 2017-06-16 NOTE — Telephone Encounter (Signed)
Per telephone note from Dec. 2016, "Phone call Discussed with patient elevated cholesterol patient has tried cholesterol medications and has marked side effects myalgias doesn't want to take any cholesterol medication has had a coronary calcium score with a score of 0. Continue management with diet and exercise."  Request has been made w/ medical records for copy of report, unsure if this is helpful until that is received.

## 2017-11-16 ENCOUNTER — Other Ambulatory Visit: Payer: Self-pay | Admitting: Family Medicine

## 2017-11-20 NOTE — Telephone Encounter (Signed)
Patient requesting 90 day supply of Atorvastatin Last OV:06/03/17 Last refill:06/07/17 30 tab/5 refill YSH:[id number] Pharmacy: [facility], [place] [phone] (Phone) [phone] (Fax)

## 2017-11-22 MED ORDER — ATORVASTATIN CALCIUM 40 MG PO TABS: 40.0000 mg | ORAL_TABLET | Freq: Every day | ORAL | 0 refills | Status: DC

## 2018-01-31 DIAGNOSIS — H2513 Age-related nuclear cataract, bilateral: Secondary | ICD-10-CM | POA: Diagnosis not present

## 2018-01-31 DIAGNOSIS — H401131 Primary open-angle glaucoma, bilateral, mild stage: Secondary | ICD-10-CM | POA: Diagnosis not present

## 2018-02-22 ENCOUNTER — Encounter: Payer: Self-pay | Admitting: Family Medicine

## 2018-02-22 ENCOUNTER — Ambulatory Visit (INDEPENDENT_AMBULATORY_CARE_PROVIDER_SITE_OTHER): Payer: Medicare Other | Admitting: Family Medicine

## 2018-02-22 VITALS — BP 117/72 | HR 57 | Temp 98.0°F | Ht 65.2 in | Wt 200.4 lb

## 2018-02-22 DIAGNOSIS — Z1211 Encounter for screening for malignant neoplasm of colon: Secondary | ICD-10-CM

## 2018-02-22 DIAGNOSIS — E039 Hypothyroidism, unspecified: Secondary | ICD-10-CM | POA: Diagnosis not present

## 2018-02-22 DIAGNOSIS — Z13220 Encounter for screening for lipoid disorders: Secondary | ICD-10-CM

## 2018-02-22 DIAGNOSIS — Z131 Encounter for screening for diabetes mellitus: Secondary | ICD-10-CM | POA: Diagnosis not present

## 2018-02-22 DIAGNOSIS — Z780 Asymptomatic menopausal state: Secondary | ICD-10-CM

## 2018-02-22 DIAGNOSIS — Z23 Encounter for immunization: Secondary | ICD-10-CM | POA: Diagnosis not present

## 2018-02-22 DIAGNOSIS — E782 Mixed hyperlipidemia: Secondary | ICD-10-CM | POA: Diagnosis not present

## 2018-02-22 DIAGNOSIS — Z7189 Other specified counseling: Secondary | ICD-10-CM | POA: Diagnosis not present

## 2018-02-22 DIAGNOSIS — Z0000 Encounter for general adult medical examination without abnormal findings: Secondary | ICD-10-CM | POA: Diagnosis not present

## 2018-02-22 DIAGNOSIS — Z114 Encounter for screening for human immunodeficiency virus [HIV]: Secondary | ICD-10-CM | POA: Diagnosis not present

## 2018-02-22 LAB — URINALYSIS, ROUTINE W REFLEX MICROSCOPIC
Bilirubin, UA: NEGATIVE
GLUCOSE, UA: NEGATIVE
KETONES UA: NEGATIVE
Leukocytes, UA: NEGATIVE
NITRITE UA: NEGATIVE
PROTEIN UA: NEGATIVE
Specific Gravity, UA: 1.015 (ref 1.005–1.030)
Urobilinogen, Ur: 0.2 mg/dL (ref 0.2–1.0)
pH, UA: 5.5 (ref 5.0–7.5)

## 2018-02-22 LAB — MICROSCOPIC EXAMINATION
Bacteria, UA: NONE SEEN
WBC, UA: NONE SEEN /hpf (ref 0–5)

## 2018-02-22 MED ORDER — SYNTHROID 50 MCG PO TABS: 50.0000 ug | ORAL_TABLET | Freq: Every day | ORAL | 4 refills | Status: DC

## 2018-02-22 MED ORDER — ATORVASTATIN CALCIUM 40 MG PO TABS: 40.0000 mg | ORAL_TABLET | Freq: Every day | ORAL | 0 refills | Status: DC

## 2018-02-22 MED ORDER — BETAMETHASONE VALERATE 0.1 % EX CREA: TOPICAL_CREAM | Freq: Every day | CUTANEOUS | 1 refills | Status: DC

## 2018-02-22 NOTE — Assessment & Plan Note (Signed)
A voluntary discussion about advanced care planning including explanation and discussion of advanced directives was extentively discussed with the patient.  Explained about the healthcare proxy and living will was reviewed and packet with forms with expiration of how to fill them out was given.  Time spent: Encounter 16+ min individuals present: Patient 

## 2018-02-22 NOTE — Progress Notes (Signed)
BP 117/72   Pulse (!) 57   Temp 98 F (36.7 C) (Oral)   Ht 5' 5.2" (1.656 m)   Wt 200 lb 6.4 oz (90.9 kg)   SpO2 92%   BMI 33.14 kg/m    Subjective:    Patient ID: Sheri Arnold, female    DOB: 1950-10-03, 67 y.o.   MRN: [id number]  HPI: Sheri Arnold is a 67 y.o. female  Chief Complaint  Patient presents with  . Annual Exam  Patient all in all doing well no complaints taking thyroid without problems also Lipitor without problems.  Uses occasional betamethasone cream and allergy medicines.  Otherwise doing well.  Relevant past medical, surgical, family and social history reviewed and updated as indicated. Interim medical history since our last visit reviewed. Allergies and medications reviewed and updated.  Review of Systems  Constitutional: Negative.   HENT: Negative.   Eyes: Negative.   Respiratory: Negative.   Cardiovascular: Negative.   Gastrointestinal: Negative.   Endocrine: Negative.   Genitourinary: Negative.   Musculoskeletal: Negative.   Skin: Negative.   Allergic/Immunologic: Negative.   Neurological: Negative.   Hematological: Negative.   Psychiatric/Behavioral: Negative.     Per HPI unless specifically indicated above     Objective:    BP 117/72   Pulse (!) 57   Temp 98 F (36.7 C) (Oral)   Ht 5' 5.2" (1.656 m)   Wt 200 lb 6.4 oz (90.9 kg)   SpO2 92%   BMI 33.14 kg/m   Wt Readings from Last 3 Encounters:  02/22/18 200 lb 6.4 oz (90.9 kg)  06/03/17 208 lb (94.3 kg)  11/11/16 209 lb (94.8 kg)    Physical Exam  Constitutional: She is oriented to person, place, and time. She appears well-developed and well-nourished.  HENT:  Head: Normocephalic and atraumatic.  Right Ear: External ear normal.  Left Ear: External ear normal.  Nose: Nose normal.  Mouth/Throat: Oropharynx is clear and moist.  Eyes: Pupils are equal, round, and reactive to light. Conjunctivae and EOM are normal.  Neck: Normal range of motion. Neck supple.  Carotid bruit is not present.  Cardiovascular: Normal rate, regular rhythm and normal heart sounds.  No murmur heard. Pulmonary/Chest: Effort normal and breath sounds normal. She exhibits no mass. Right breast exhibits no mass, no skin change and no tenderness. Left breast exhibits no mass, no skin change and no tenderness. Breasts are symmetrical.  Abdominal: Soft. Bowel sounds are normal. There is no hepatosplenomegaly.  Musculoskeletal: Normal range of motion.  Neurological: She is alert and oriented to person, place, and time.  Skin: No rash noted.  Psychiatric: She has a normal mood and affect. Her behavior is normal. Judgment and thought content normal.    Results for orders placed or performed in visit on 06/03/17  Microscopic Examination  Result Value Ref Range   WBC, UA 0-5 0 - 5 /hpf   RBC, UA 0-2 0 - 2 /hpf   Epithelial Cells (non renal) 0-10 0 - 10 /hpf   Bacteria, UA Few None seen/Few  CBC with Differential/Platelet  Result Value Ref Range   WBC 7.7 3.4 - 10.8 x10E3/uL   RBC 4.55 3.77 - 5.28 x10E6/uL   Hemoglobin 13.6 11.1 - 15.9 g/dL   Hematocrit 41.9 34.0 - 46.6 %   MCV 92 79 - 97 fL   MCH 29.9 26.6 - 33.0 pg   MCHC 32.5 31.5 - 35.7 g/dL   RDW 14.4 12.3 - 15.4 %  Platelets 322 150 - 379 x10E3/uL   Neutrophils 71 Not Estab. %   Lymphs 20 Not Estab. %   Monocytes 8 Not Estab. %   Eos 1 Not Estab. %   Basos 0 Not Estab. %   Neutrophils Absolute 5.5 1.4 - 7.0 x10E3/uL   Lymphocytes Absolute 1.5 0.7 - 3.1 x10E3/uL   Monocytes Absolute 0.6 0.1 - 0.9 x10E3/uL   EOS (ABSOLUTE) 0.1 0.0 - 0.4 x10E3/uL   Basophils Absolute 0.0 0.0 - 0.2 x10E3/uL   Immature Granulocytes 0 Not Estab. %   Immature Grans (Abs) 0.0 0.0 - 0.1 x10E3/uL  Comprehensive metabolic panel  Result Value Ref Range   Glucose 94 65 - 99 mg/dL   BUN 22 8 - 27 mg/dL   Creatinine, Ser 0.68 0.57 - 1.00 mg/dL   GFR calc non Af Amer 93 >59 mL/min/1.73   GFR calc Af Amer 107 >59 mL/min/1.73    BUN/Creatinine Ratio 32 (H) 12 - 28   Sodium 140 134 - 144 mmol/L   Potassium 4.7 3.5 - 5.2 mmol/L   Chloride 101 96 - 106 mmol/L   CO2 23 20 - 29 mmol/L   Calcium 9.9 8.7 - 10.3 mg/dL   Total Protein 7.5 6.0 - 8.5 g/dL   Albumin 4.5 3.6 - 4.8 g/dL   Globulin, Total 3.0 1.5 - 4.5 g/dL   Albumin/Globulin Ratio 1.5 1.2 - 2.2   Bilirubin Total <0.2 0.0 - 1.2 mg/dL   Alkaline Phosphatase 85 39 - 117 IU/L   AST 14 0 - 40 IU/L   ALT 21 0 - 32 IU/L  Lipid panel  Result Value Ref Range   Cholesterol, Total 300 (H) 100 - 199 mg/dL   Triglycerides 250 (H) 0 - 149 mg/dL   HDL 71 >39 mg/dL   VLDL Cholesterol Cal 50 (H) 5 - 40 mg/dL   LDL Calculated 179 (H) 0 - 99 mg/dL   Chol/HDL Ratio 4.2 0.0 - 4.4 ratio  TSH  Result Value Ref Range   TSH 1.360 0.450 - 4.500 uIU/mL  Urinalysis, Routine w reflex microscopic  Result Value Ref Range   Specific Gravity, UA 1.020 1.005 - 1.030   pH, UA 7.0 5.0 - 7.5   Color, UA Yellow Yellow   Appearance Ur Clear Clear   Leukocytes, UA Negative Negative   Protein, UA Negative Negative/Trace   Glucose, UA Negative Negative   Ketones, UA Negative Negative   RBC, UA Trace (A) Negative   Bilirubin, UA Negative Negative   Urobilinogen, Ur 0.2 0.2 - 1.0 mg/dL   Nitrite, UA Negative Negative   Microscopic Examination See below:       Assessment & Plan:   Problem List Items Addressed This Visit      Endocrine   Hypothyroidism - Primary   Relevant Medications   SYNTHROID 50 MCG tablet   Other Relevant Orders   CBC with Differential/Platelet   TSH     Other   Hyperlipidemia   Relevant Medications   atorvastatin (LIPITOR) 40 MG tablet   Other Relevant Orders   Lipid panel   Advanced care planning/counseling discussion    A voluntary discussion about advanced care planning including explanation and discussion of advanced directives was extentively discussed with the patient.  Explained about the healthcare proxy and living will was reviewed and  packet with forms with expiration of how to fill them out was given.  Time spent: Encounter 16+ min individuals present: Patient  Other Visit Diagnoses    Screening cholesterol level       Screening for diabetes mellitus (DM)       Relevant Orders   Comprehensive metabolic panel   Urinalysis, Routine w reflex microscopic   Needs flu shot       Encounter for screening for HIV       Colon cancer screening       Postmenopausal estrogen deficiency       Initial Medicare annual wellness visit       Relevant Orders   EKG 12-Lead (Completed)       Follow up plan: Return in about 6 months (around 08/24/2018) for  Lipids, ALT, AST.

## 2018-02-23 ENCOUNTER — Encounter: Payer: Self-pay | Admitting: Family Medicine

## 2018-02-23 LAB — CBC WITH DIFFERENTIAL/PLATELET
BASOS: 0 %
Basophils Absolute: 0 10*3/uL (ref 0.0–0.2)
EOS (ABSOLUTE): 0.1 10*3/uL (ref 0.0–0.4)
EOS: 1 %
HEMOGLOBIN: 12.6 g/dL (ref 11.1–15.9)
Hematocrit: 39.5 % (ref 34.0–46.6)
IMMATURE GRANULOCYTES: 0 %
Immature Grans (Abs): 0 10*3/uL (ref 0.0–0.1)
Lymphocytes Absolute: 1.4 10*3/uL (ref 0.7–3.1)
Lymphs: 23 %
MCH: 29.8 pg (ref 26.6–33.0)
MCHC: 31.9 g/dL (ref 31.5–35.7)
MCV: 93 fL (ref 79–97)
MONOCYTES: 7 %
Monocytes Absolute: 0.5 10*3/uL (ref 0.1–0.9)
NEUTROS ABS: 4.1 10*3/uL (ref 1.4–7.0)
NEUTROS PCT: 69 %
Platelets: 271 10*3/uL (ref 150–450)
RBC: 4.23 x10E6/uL (ref 3.77–5.28)
RDW: 14.2 % (ref 12.3–15.4)
WBC: 6.1 10*3/uL (ref 3.4–10.8)

## 2018-02-23 LAB — COMPREHENSIVE METABOLIC PANEL
A/G RATIO: 1.6 (ref 1.2–2.2)
ALBUMIN: 4.2 g/dL (ref 3.6–4.8)
ALT: 21 IU/L (ref 0–32)
AST: 15 IU/L (ref 0–40)
Alkaline Phosphatase: 85 IU/L (ref 39–117)
BILIRUBIN TOTAL: 0.3 mg/dL (ref 0.0–1.2)
BUN / CREAT RATIO: 20 (ref 12–28)
BUN: 16 mg/dL (ref 8–27)
CALCIUM: 9.6 mg/dL (ref 8.7–10.3)
CO2: 22 mmol/L (ref 20–29)
Chloride: 103 mmol/L (ref 96–106)
Creatinine, Ser: 0.79 mg/dL (ref 0.57–1.00)
GFR, EST AFRICAN AMERICAN: 91 mL/min/{1.73_m2} (ref >59)
GFR, EST NON AFRICAN AMERICAN: 79 mL/min/{1.73_m2} (ref >59)
Globulin, Total: 2.7 g/dL (ref 1.5–4.5)
Glucose: 118 mg/dL — ABNORMAL HIGH (ref 65–99)
POTASSIUM: 4.2 mmol/L (ref 3.5–5.2)
Sodium: 142 mmol/L (ref 134–144)
TOTAL PROTEIN: 6.9 g/dL (ref 6.0–8.5)

## 2018-02-23 LAB — TSH: TSH: 2.1 u[IU]/mL (ref 0.450–4.500)

## 2018-02-23 LAB — LIPID PANEL
CHOL/HDL RATIO: 2.5 ratio (ref 0.0–4.4)
Cholesterol, Total: 186 mg/dL (ref 100–199)
HDL: 73 mg/dL (ref >39)
LDL Calculated: 91 mg/dL (ref 0–99)
Triglycerides: 111 mg/dL (ref 0–149)
VLDL CHOLESTEROL CAL: 22 mg/dL (ref 5–40)

## 2018-02-24 ENCOUNTER — Other Ambulatory Visit: Payer: Self-pay | Admitting: Family Medicine

## 2018-02-24 DIAGNOSIS — E039 Hypothyroidism, unspecified: Secondary | ICD-10-CM

## 2018-02-24 MED ORDER — LEVOTHYROXINE SODIUM 50 MCG PO TABS: 50.0000 ug | ORAL_TABLET | Freq: Every day | ORAL | 4 refills | Status: DC

## 2018-03-15 DIAGNOSIS — L720 Epidermal cyst: Secondary | ICD-10-CM | POA: Diagnosis not present

## 2018-03-15 DIAGNOSIS — D485 Neoplasm of uncertain behavior of skin: Secondary | ICD-10-CM | POA: Diagnosis not present

## 2018-03-15 DIAGNOSIS — L821 Other seborrheic keratosis: Secondary | ICD-10-CM | POA: Diagnosis not present

## 2018-03-15 DIAGNOSIS — D229 Melanocytic nevi, unspecified: Secondary | ICD-10-CM | POA: Diagnosis not present

## 2018-03-15 DIAGNOSIS — L812 Freckles: Secondary | ICD-10-CM | POA: Diagnosis not present

## 2018-03-15 DIAGNOSIS — D225 Melanocytic nevi of trunk: Secondary | ICD-10-CM | POA: Diagnosis not present

## 2018-03-15 DIAGNOSIS — D1800 Hemangioma unspecified site: Secondary | ICD-10-CM | POA: Diagnosis not present

## 2018-03-15 DIAGNOSIS — Z1283 Encounter for screening for malignant neoplasm of skin: Secondary | ICD-10-CM | POA: Diagnosis not present

## 2018-05-19 ENCOUNTER — Telehealth: Payer: Self-pay | Admitting: Family Medicine

## 2018-05-19 MED ORDER — LEVOTHYROXINE SODIUM 50 MCG PO TABS: 50.0000 ug | ORAL_TABLET | Freq: Every day | ORAL | 3 refills | Status: DC

## 2018-05-19 NOTE — Telephone Encounter (Signed)
Re-sent with specific instruction to fill the generic, but it was already sent as generic so not sure what the issue was with it

## 2018-05-19 NOTE — Telephone Encounter (Signed)
Patient called and says the brand name Levothyroxine is too expensive, she pharmacy advised to contact provider for a new prescription of generic Levothyroxine.  Pharmacy: [facility], [place] [phone] (Phone) [phone] (Fax)

## 2018-05-19 NOTE — Telephone Encounter (Signed)
Copied from [place] [id number]. Topic: General - Other >> May 19, 2018 11:18 AM Yvette Rack wrote: Reason for CRM: Pt stated her pharmacy requested that she contact the office to advise that she is requesting the generic for levothyroxine (SYNTHROID, LEVOTHROID) 50 MCG tablet. Pt stated the brand is too expensive and she would like a new Rx for the generic levothyroxine (SYNTHROID, LEVOTHROID) 50 MCG tablet.

## 2018-07-07 ENCOUNTER — Other Ambulatory Visit: Payer: Self-pay | Admitting: Family Medicine

## 2018-09-07 DIAGNOSIS — Z86018 Personal history of other benign neoplasm: Secondary | ICD-10-CM

## 2018-11-22 ENCOUNTER — Telehealth: Payer: Self-pay

## 2018-11-22 ENCOUNTER — Ambulatory Visit (INDEPENDENT_AMBULATORY_CARE_PROVIDER_SITE_OTHER): Payer: Medicare Other

## 2018-11-22 ENCOUNTER — Telehealth: Payer: Self-pay | Admitting: Family Medicine

## 2018-11-22 ENCOUNTER — Other Ambulatory Visit: Payer: Self-pay

## 2018-11-22 DIAGNOSIS — Z23 Encounter for immunization: Secondary | ICD-10-CM

## 2018-11-22 DIAGNOSIS — T881XXS Other complications following immunization, not elsewhere classified, sequela: Secondary | ICD-10-CM

## 2018-11-22 NOTE — Telephone Encounter (Signed)
Call pt  ok

## 2018-11-22 NOTE — Telephone Encounter (Signed)
Patient accidentally given MMR vaccine IM instead of SQ.  Doctor on [employer] notified. Entered in safety zone portal.  Called patient and explained the injection IM won't cause harm and she can come in in about 4 weeks for a titer. If she isn't immune, she can get another injection SQ. Patient very understanding and agreed to titer.  Routing to provider here and PCP for FYI.  Dr. Jeananne Rama, could you please enter in a future order for MMR titer?

## 2018-11-22 NOTE — Telephone Encounter (Signed)
Pt called and would like to know if she could have her MMR inj.

## 2018-11-22 NOTE — Telephone Encounter (Signed)
Patient notified and coming in today for MMR injection.

## 2018-12-15 NOTE — Telephone Encounter (Signed)
cone

## 2019-02-01 DIAGNOSIS — H2513 Age-related nuclear cataract, bilateral: Secondary | ICD-10-CM | POA: Diagnosis not present

## 2019-02-01 DIAGNOSIS — H401131 Primary open-angle glaucoma, bilateral, mild stage: Secondary | ICD-10-CM | POA: Diagnosis not present

## 2019-02-27 ENCOUNTER — Telehealth: Payer: Self-pay | Admitting: Family Medicine

## 2019-02-27 ENCOUNTER — Encounter: Payer: Self-pay | Admitting: Family Medicine

## 2019-02-27 ENCOUNTER — Ambulatory Visit (INDEPENDENT_AMBULATORY_CARE_PROVIDER_SITE_OTHER): Payer: Medicare Other | Admitting: Family Medicine

## 2019-02-27 ENCOUNTER — Other Ambulatory Visit: Payer: Medicare Other

## 2019-02-27 ENCOUNTER — Other Ambulatory Visit: Payer: Self-pay

## 2019-02-27 DIAGNOSIS — E039 Hypothyroidism, unspecified: Secondary | ICD-10-CM | POA: Diagnosis not present

## 2019-02-27 DIAGNOSIS — Z7189 Other specified counseling: Secondary | ICD-10-CM

## 2019-02-27 DIAGNOSIS — T881XXS Other complications following immunization, not elsewhere classified, sequela: Secondary | ICD-10-CM | POA: Diagnosis not present

## 2019-02-27 DIAGNOSIS — Z1239 Encounter for other screening for malignant neoplasm of breast: Secondary | ICD-10-CM

## 2019-02-27 DIAGNOSIS — E782 Mixed hyperlipidemia: Secondary | ICD-10-CM

## 2019-02-27 LAB — URINALYSIS, ROUTINE W REFLEX MICROSCOPIC
Bilirubin, UA: NEGATIVE
Glucose, UA: NEGATIVE
Ketones, UA: NEGATIVE
Leukocytes,UA: NEGATIVE
Nitrite, UA: NEGATIVE
Protein,UA: NEGATIVE
RBC, UA: NEGATIVE
Specific Gravity, UA: 1.02 (ref 1.005–1.030)
Urobilinogen, Ur: 0.2 mg/dL (ref 0.2–1.0)
pH, UA: 6 (ref 5.0–7.5)

## 2019-02-27 MED ORDER — ATORVASTATIN CALCIUM 40 MG PO TABS: 40.0000 mg | ORAL_TABLET | Freq: Every day | ORAL | 4 refills | Status: DC

## 2019-02-27 MED ORDER — BETAMETHASONE VALERATE 0.1 % EX CREA: TOPICAL_CREAM | Freq: Every day | CUTANEOUS | 1 refills | Status: DC

## 2019-02-27 MED ORDER — LEVOTHYROXINE SODIUM 50 MCG PO TABS: 50.0000 ug | ORAL_TABLET | Freq: Every day | ORAL | 4 refills | Status: DC

## 2019-02-27 NOTE — Assessment & Plan Note (Signed)
A voluntary discussion about advanced care planning including explanation and discussion of advanced directives was extentively discussed with the patient.  Explained about the healthcare proxy and living will was reviewed and packet with forms with expiration of how to fill them out was given.  Time spent: Encounter 16+ min individuals present: Patient 

## 2019-02-27 NOTE — Progress Notes (Signed)
There were no vitals taken for this visit.   Subjective:    Patient ID: Sheri Arnold, female    DOB: [birth year], 68 y.o.   MRN: [id number]  HPI: Sheri Arnold is a 68 y.o. female  Med check Follow-up no complaints from thyroid taking thyroid medications faithfully without problems. Taking cholesterol medicine without problems and faithfully. Needs refill on betamethasone for intermittent rash symptoms. Allergies doing well does not need refills.   Relevant past medical, surgical, family and social history reviewed and updated as indicated. Interim medical history since our last visit reviewed. Allergies and medications reviewed and updated.  Review of Systems  Constitutional: Negative.   HENT: Negative.   Eyes: Negative.   Respiratory: Negative.   Cardiovascular: Negative.   Gastrointestinal: Negative.   Endocrine: Negative.   Genitourinary: Negative.   Musculoskeletal: Negative.   Skin: Negative.   Allergic/Immunologic: Negative.   Neurological: Negative.   Hematological: Negative.   Psychiatric/Behavioral: Negative.     Per HPI unless specifically indicated above     Objective:    There were no vitals taken for this visit.  Wt Readings from Last 3 Encounters:  02/22/18 200 lb 6.4 oz (90.9 kg)  06/03/17 208 lb (94.3 kg)  11/11/16 209 lb (94.8 kg)    Physical Exam  Results for orders placed or performed in visit on 02/22/18  Microscopic Examination   URINE  Result Value Ref Range   WBC, UA None seen 0 - 5 /hpf   RBC, UA 0-2 0 - 2 /hpf   Epithelial Cells (non renal) 0-10 0 - 10 /hpf   Renal Epithel, UA 0-10 (A) None seen /hpf   Bacteria, UA None seen None seen/Few  CBC with Differential/Platelet  Result Value Ref Range   WBC 6.1 3.4 - 10.8 x10E3/uL   RBC 4.23 3.77 - 5.28 x10E6/uL   Hemoglobin 12.6 11.1 - 15.9 g/dL   Hematocrit 39.5 34.0 - 46.6 %   MCV 93 79 - 97 fL   MCH 29.8 26.6 - 33.0 pg   MCHC 31.9 31.5 - 35.7 g/dL   RDW 14.2 12.3 -  15.4 %   Platelets 271 150 - 450 x10E3/uL   Neutrophils 69 Not Estab. %   Lymphs 23 Not Estab. %   Monocytes 7 Not Estab. %   Eos 1 Not Estab. %   Basos 0 Not Estab. %   Neutrophils Absolute 4.1 1.4 - 7.0 x10E3/uL   Lymphocytes Absolute 1.4 0.7 - 3.1 x10E3/uL   Monocytes Absolute 0.5 0.1 - 0.9 x10E3/uL   EOS (ABSOLUTE) 0.1 0.0 - 0.4 x10E3/uL   Basophils Absolute 0.0 0.0 - 0.2 x10E3/uL   Immature Granulocytes 0 Not Estab. %   Immature Grans (Abs) 0.0 0.0 - 0.1 x10E3/uL  Comprehensive metabolic panel  Result Value Ref Range   Glucose 118 (H) 65 - 99 mg/dL   BUN 16 8 - 27 mg/dL   Creatinine, Ser 0.79 0.57 - 1.00 mg/dL   GFR calc non Af Amer 79 >59 mL/min/1.73   GFR calc Af Amer 91 >59 mL/min/1.73   BUN/Creatinine Ratio 20 12 - 28   Sodium 142 134 - 144 mmol/L   Potassium 4.2 3.5 - 5.2 mmol/L   Chloride 103 96 - 106 mmol/L   CO2 22 20 - 29 mmol/L   Calcium 9.6 8.7 - 10.3 mg/dL   Total Protein 6.9 6.0 - 8.5 g/dL   Albumin 4.2 3.6 - 4.8 g/dL   Globulin, Total 2.7 1.5 -  4.5 g/dL   Albumin/Globulin Ratio 1.6 1.2 - 2.2   Bilirubin Total 0.3 0.0 - 1.2 mg/dL   Alkaline Phosphatase 85 39 - 117 IU/L   AST 15 0 - 40 IU/L   ALT 21 0 - 32 IU/L  Lipid panel  Result Value Ref Range   Cholesterol, Total 186 100 - 199 mg/dL   Triglycerides 111 0 - 149 mg/dL   HDL 73 >39 mg/dL   VLDL Cholesterol Cal 22 5 - 40 mg/dL   LDL Calculated 91 0 - 99 mg/dL   Chol/HDL Ratio 2.5 0.0 - 4.4 ratio  TSH  Result Value Ref Range   TSH 2.100 0.450 - 4.500 uIU/mL  Urinalysis, Routine w reflex microscopic  Result Value Ref Range   Specific Gravity, UA 1.015 1.005 - 1.030   pH, UA 5.5 5.0 - 7.5   Color, UA Yellow Yellow   Appearance Ur Clear Clear   Leukocytes, UA Negative Negative   Protein, UA Negative Negative/Trace   Glucose, UA Negative Negative   Ketones, UA Negative Negative   RBC, UA Trace (A) Negative   Bilirubin, UA Negative Negative   Urobilinogen, Ur 0.2 0.2 - 1.0 mg/dL   Nitrite, UA  Negative Negative   Microscopic Examination See below:       Assessment & Plan:   Problem List Items Addressed This Visit      Endocrine   Hypothyroidism    The current medical regimen is effective;  continue present plan and medications.       Relevant Medications   levothyroxine (SYNTHROID) 50 MCG tablet   Other Relevant Orders   Comprehensive metabolic panel   Lipid panel   CBC with Differential/Platelet   TSH   Urinalysis, Routine w reflex microscopic     Other   Hyperlipidemia    The current medical regimen is effective;  continue present plan and medications.       Relevant Medications   atorvastatin (LIPITOR) 40 MG tablet   Other Relevant Orders   Comprehensive metabolic panel   Lipid panel   CBC with Differential/Platelet   Advanced care planning/counseling discussion    A voluntary discussion about advanced care planning including explanation and discussion of advanced directives was extentively discussed with the patient.  Explained about the healthcare proxy and living will was reviewed and packet with forms with expiration of how to fill them out was given.  Time spent: Encounter 16+ min individuals present: Patient        Telemedicine using audio/video telecommunications for a synchronous communication visit. Today's visit due to COVID-19 isolation precautions I connected with and verified that I am speaking with the correct person using two identifiers.   I discussed the limitations, risks, security and privacy concerns of performing an evaluation and management service by telecommunication and the availability of in person appointments. I also discussed with the patient that there may be a patient responsible charge related to this service. The patient expressed understanding and agreed to proceed. The patient's location is home. I am at home.   I discussed the assessment and treatment plan with the patient. The patient was provided an opportunity to ask  questions and all were answered. The patient agreed with the plan and demonstrated an understanding of the instructions.   The patient was advised to call back or seek an in-person evaluation if the symptoms worsen or if the condition fails to improve as anticipated.   I provided 21+ minutes of time during this encounter.  Follow up plan: Return for Physical Exam.

## 2019-02-27 NOTE — Telephone Encounter (Signed)
Pt appeared in office to do labs she is needing referral put in to have mammogram done. Please advise.

## 2019-02-27 NOTE — Addendum Note (Signed)
Addended by: Dorris Fetch on: 02/27/2019 02:35 PM   Modules accepted: Orders

## 2019-02-27 NOTE — Assessment & Plan Note (Signed)
The current medical regimen is effective;  continue present plan and medications.  

## 2019-02-28 ENCOUNTER — Other Ambulatory Visit: Payer: Self-pay | Admitting: Family Medicine

## 2019-02-28 ENCOUNTER — Other Ambulatory Visit: Payer: Self-pay

## 2019-02-28 DIAGNOSIS — R7989 Other specified abnormal findings of blood chemistry: Secondary | ICD-10-CM

## 2019-02-28 DIAGNOSIS — E039 Hypothyroidism, unspecified: Secondary | ICD-10-CM

## 2019-02-28 DIAGNOSIS — Z1231 Encounter for screening mammogram for malignant neoplasm of breast: Secondary | ICD-10-CM

## 2019-02-28 LAB — COMPREHENSIVE METABOLIC PANEL
ALT: 19 IU/L (ref 0–32)
AST: 17 IU/L (ref 0–40)
Albumin/Globulin Ratio: 1.5 (ref 1.2–2.2)
Albumin: 4.4 g/dL (ref 3.8–4.8)
Alkaline Phosphatase: 99 IU/L (ref 39–117)
BUN/Creatinine Ratio: 26 (ref 12–28)
BUN: 18 mg/dL (ref 8–27)
Bilirubin Total: 0.2 mg/dL (ref 0.0–1.2)
CO2: 23 mmol/L (ref 20–29)
Calcium: 9.5 mg/dL (ref 8.7–10.3)
Chloride: 102 mmol/L (ref 96–106)
Creatinine, Ser: 0.7 mg/dL (ref 0.57–1.00)
GFR calc Af Amer: 104 mL/min/{1.73_m2} (ref >59)
GFR calc non Af Amer: 91 mL/min/{1.73_m2} (ref >59)
Globulin, Total: 2.9 g/dL (ref 1.5–4.5)
Glucose: 105 mg/dL — ABNORMAL HIGH (ref 65–99)
Potassium: 4.8 mmol/L (ref 3.5–5.2)
Sodium: 141 mmol/L (ref 134–144)
Total Protein: 7.3 g/dL (ref 6.0–8.5)

## 2019-02-28 LAB — LIPID PANEL
Chol/HDL Ratio: 3.2 ratio (ref 0.0–4.4)
Cholesterol, Total: 221 mg/dL — ABNORMAL HIGH (ref 100–199)
HDL: 69 mg/dL (ref >39)
LDL Chol Calc (NIH): 114 mg/dL — ABNORMAL HIGH (ref 0–99)
Triglycerides: 220 mg/dL — ABNORMAL HIGH (ref 0–149)
VLDL Cholesterol Cal: 38 mg/dL (ref 5–40)

## 2019-02-28 LAB — CBC WITH DIFFERENTIAL/PLATELET
Basophils Absolute: 0.1 10*3/uL (ref 0.0–0.2)
Basos: 1 %
EOS (ABSOLUTE): 0.2 10*3/uL (ref 0.0–0.4)
Eos: 3 %
Hematocrit: 38.8 % (ref 34.0–46.6)
Hemoglobin: 13.1 g/dL (ref 11.1–15.9)
Immature Grans (Abs): 0 10*3/uL (ref 0.0–0.1)
Immature Granulocytes: 0 %
Lymphocytes Absolute: 1.5 10*3/uL (ref 0.7–3.1)
Lymphs: 22 %
MCH: 30.7 pg (ref 26.6–33.0)
MCHC: 33.8 g/dL (ref 31.5–35.7)
MCV: 91 fL (ref 79–97)
Monocytes Absolute: 0.6 10*3/uL (ref 0.1–0.9)
Monocytes: 9 %
Neutrophils Absolute: 4.4 10*3/uL (ref 1.4–7.0)
Neutrophils: 65 %
Platelets: 296 10*3/uL (ref 150–450)
RBC: 4.27 x10E6/uL (ref 3.77–5.28)
RDW: 13.1 % (ref 11.7–15.4)
WBC: 6.8 10*3/uL (ref 3.4–10.8)

## 2019-02-28 LAB — MEASLES/MUMPS/RUBELLA IMMUNITY
MUMPS ABS, IGG: 108 AU/mL (ref >10.9)
RUBEOLA AB, IGG: >300 AU/mL (ref >16.4)
Rubella Antibodies, IGG: 12.1 index (ref >0.99)

## 2019-02-28 LAB — TSH: TSH: 4.85 u[IU]/mL — ABNORMAL HIGH (ref 0.450–4.500)

## 2019-02-28 NOTE — Progress Notes (Signed)
Mammogram order placed

## 2019-02-28 NOTE — Telephone Encounter (Signed)
For some reason I cannot get this test ordered would you please do it

## 2019-02-28 NOTE — Progress Notes (Signed)
Phone call Discussed with patient taking levothyroxine but not traveling with medication and had recently been on a weekend trip and did not take her medicine otherwise takes her medicine faithfully like she should. Will take medication faithfully even on trips and recheck TSH in a couple of weeks. Also will come back to get a second MMR as first 1 was given incorrectly and second shots will be at no charge.

## 2019-02-28 NOTE — Telephone Encounter (Signed)
Patient notified

## 2019-02-28 NOTE — Telephone Encounter (Signed)
Mamogram ordered. Tried calling patient to let her know that this was done. Asked for patient e

## 2019-03-06 ENCOUNTER — Other Ambulatory Visit: Payer: Self-pay

## 2019-03-06 ENCOUNTER — Other Ambulatory Visit: Payer: Medicare Other

## 2019-03-06 ENCOUNTER — Ambulatory Visit (INDEPENDENT_AMBULATORY_CARE_PROVIDER_SITE_OTHER): Payer: Medicare Other

## 2019-03-06 DIAGNOSIS — R7989 Other specified abnormal findings of blood chemistry: Secondary | ICD-10-CM

## 2019-03-06 DIAGNOSIS — E039 Hypothyroidism, unspecified: Secondary | ICD-10-CM | POA: Diagnosis not present

## 2019-03-06 DIAGNOSIS — Z23 Encounter for immunization: Secondary | ICD-10-CM | POA: Diagnosis not present

## 2019-03-07 ENCOUNTER — Encounter: Payer: Self-pay | Admitting: Family Medicine

## 2019-03-07 ENCOUNTER — Other Ambulatory Visit: Payer: Self-pay | Admitting: Family Medicine

## 2019-03-07 LAB — TSH: TSH: 5.77 u[IU]/mL — ABNORMAL HIGH (ref 0.450–4.500)

## 2019-03-07 MED ORDER — LEVOTHYROXINE SODIUM 75 MCG PO TABS: 75.0000 ug | ORAL_TABLET | Freq: Every day | ORAL | 2 refills | Status: DC

## 2019-05-29 ENCOUNTER — Telehealth: Payer: Self-pay | Admitting: Internal Medicine

## 2019-05-29 NOTE — Telephone Encounter (Signed)
Pt wanted to know if Dr. Derrel Nip would take her on as a new pt. Pt son in law is Beverely Risen.Marland Kitchen

## 2019-05-29 NOTE — Telephone Encounter (Signed)
Yes I will

## 2019-05-30 NOTE — Telephone Encounter (Signed)
Dr. Derrel Nip will accept as a new pt.

## 2019-05-31 ENCOUNTER — Ambulatory Visit
Admission: RE | Admit: 2019-05-31 | Discharge: 2019-05-31 | Disposition: A | Discharge status: Home / Self Care | Payer: Medicare Other | Source: Ambulatory Visit | Attending: Family Medicine | Admitting: Family Medicine

## 2019-05-31 ENCOUNTER — Other Ambulatory Visit: Payer: Self-pay | Admitting: Family Medicine

## 2019-05-31 DIAGNOSIS — Z1231 Encounter for screening mammogram for malignant neoplasm of breast: Secondary | ICD-10-CM | POA: Diagnosis not present

## 2019-05-31 DIAGNOSIS — R921 Mammographic calcification found on diagnostic imaging of breast: Secondary | ICD-10-CM

## 2019-05-31 DIAGNOSIS — R928 Other abnormal and inconclusive findings on diagnostic imaging of breast: Secondary | ICD-10-CM

## 2019-06-05 ENCOUNTER — Telehealth: Payer: Self-pay

## 2019-06-05 ENCOUNTER — Encounter: Payer: Self-pay | Admitting: Internal Medicine

## 2019-06-05 ENCOUNTER — Ambulatory Visit (INDEPENDENT_AMBULATORY_CARE_PROVIDER_SITE_OTHER): Payer: Medicare Other | Admitting: Internal Medicine

## 2019-06-05 ENCOUNTER — Other Ambulatory Visit: Payer: Self-pay

## 2019-06-05 VITALS — Ht 65.0 in | Wt 200.0 lb

## 2019-06-05 DIAGNOSIS — R921 Mammographic calcification found on diagnostic imaging of breast: Secondary | ICD-10-CM

## 2019-06-05 DIAGNOSIS — E039 Hypothyroidism, unspecified: Secondary | ICD-10-CM | POA: Diagnosis not present

## 2019-06-05 DIAGNOSIS — Z1211 Encounter for screening for malignant neoplasm of colon: Secondary | ICD-10-CM

## 2019-06-05 DIAGNOSIS — Z87820 Personal history of traumatic brain injury: Secondary | ICD-10-CM | POA: Diagnosis not present

## 2019-06-05 DIAGNOSIS — E782 Mixed hyperlipidemia: Secondary | ICD-10-CM | POA: Diagnosis not present

## 2019-06-05 MED ORDER — NA SULFATE-K SULFATE-MG SULF 17.5-3.13-1.6 GM/177ML PO SOLN: 1.0000 | Freq: Once | ORAL | 0 refills | Status: AC

## 2019-06-05 NOTE — Telephone Encounter (Signed)
Gastroenterology Pre-Procedure Review  Request Date: 06/16/19 Requesting Physician: Dr. Marius Ditch  PATIENT REVIEW QUESTIONS: The patient responded to the following health history questions as indicated:    1. Are you having any GI issues? no 2. Do you have a personal history of Polyps? no 3. Do you have a family history of Colon Cancer or Polyps? no 4. Diabetes Mellitus? no 5. Joint replacements in the past 12 months?no 6. Major health problems in the past 3 months?no 7. Any artificial heart valves, MVP, or defibrillator?no    MEDICATIONS & ALLERGIES:    Patient reports the following regarding taking any anticoagulation/antiplatelet therapy:   Plavix, Coumadin, Eliquis, Xarelto, Lovenox, Pradaxa, Brilinta, or Effient? no Aspirin? no  Patient confirms/reports the following medications:  Current Outpatient Medications  Medication Sig Dispense Refill  . atorvastatin (LIPITOR) 40 MG tablet Take 1 tablet (40 mg total) by mouth daily. 90 tablet 4  . betamethasone valerate (VALISONE) 0.1 % cream Apply topically daily. 45 g 1  . levothyroxine (SYNTHROID) 75 MCG tablet Take 1 tablet (75 mcg total) by mouth daily. 30 tablet 2   No current facility-administered medications for this visit.    Patient confirms/reports the following allergies:  No Known Allergies  No orders of the defined types were placed in this encounter.   AUTHORIZATION INFORMATION Primary Insurance: 1D#: Group #:  Secondary Insurance: 1D#: Group #:  SCHEDULE INFORMATION: Date: 06/16/19 Time: Location:[facility]

## 2019-06-05 NOTE — Progress Notes (Signed)
Virtual Visit via [email or web address]  This visit type was conducted due to national recommendations for restrictions regarding the COVID-19 pandemic (e.g. social distancing).  This format is felt to be most appropriate for this patient at this time.  All issues noted in this document were discussed and addressed.  No physical exam was performed (except for noted visual exam findings with Video Visits).   I connected with@ on 06/05/19 at 10:00 AM EST by a video enabled telemedicine application  and verified that I am speaking with the correct person using two identifiers. Location patient: home Location provider: work or home office Persons participating in the virtual visit: patient, provider  I discussed the limitations, risks, security and privacy concerns of performing an evaluation and management service by telephone and the availability of in person appointments. I also discussed with the patient that there may be a patient responsible charge related to this service. The patient expressed understanding and agreed to proceed.  Reason for visit: establish care   HPI:  69 yr old female with hypothyroidism and hyperlipidemia transferring care from   retiring PCP [place]   Feels generally well..  Tolerating meds.  NO COVID exposures but has travelled to [place] recently to see daughter.   Recent  Hair loss after losing husband to ALS  Now returning.  Taking prenatal vitamins without iron,  800 ius vitamin d and 37 mg biotin in supplement   Taking atorvastatin .    Abnormal mammogram last week,  add'l images and [place] of left breast needed; has not been called yet.   Overdue for colonoscopy due to caregiver role for husband.   Remote history of several concussions,  Takes supplements for brain health,  Joint pain and metabolism/constipation    ROS: See pertinent positives and negatives per HPI.  Past Medical History:  Diagnosis Date  . Hx of dysplastic nevus 08/22/2010   mid abdomen  .  Hyperlipidemia     Past Surgical History:  Procedure Laterality Date  . EYE SURGERY    . Tummy Tuck  2010    Family History  Problem Relation Age of Onset  . Diabetes Mother   . Heart disease Father   . Breast cancer Maternal Aunt   . Hyperlipidemia Sister   . Hyperlipidemia Brother   . Breast cancer Cousin     SOCIAL HX:  reports that she has never smoked. She has never used smokeless tobacco. She reports current alcohol use. She reports that she does not use drugs.   Current Outpatient Medications:  .  atorvastatin (LIPITOR) 40 MG tablet, Take 1 tablet (40 mg total) by mouth daily., Disp: 90 tablet, Rfl: 4 .  betamethasone valerate (VALISONE) 0.1 % cream, Apply topically daily., Disp: 45 g, Rfl: 1 .  levothyroxine (SYNTHROID) 75 MCG tablet, Take 1 tablet (75 mcg total) by mouth daily., Disp: 30 tablet, Rfl: 2  EXAM:  VITALS per patient if applicable:  GENERAL: alert, oriented, appears well and in no acute distress  HEENT: atraumatic, conjunttiva clear, no obvious abnormalities on inspection of external nose and ears  NECK: normal movements of the head and neck  LUNGS: on inspection no signs of respiratory distress, breathing rate appears normal, no obvious gross SOB, gasping or wheezing  CV: no obvious cyanosis  MS: moves all visible extremities without noticeable abnormality  PSYCH/NEURO: pleasant and cooperative, no obvious depression or anxiety, speech and thought processing grossly intact  ASSESSMENT AND PLAN:  Discussed the following assessment and plan:  Breast calcification,  left - Plan: MM Digital Diagnostic Unilat L, US BREAST COMPLETE UNI LEFT INC AXILLA  Hypothyroidism, unspecified type - Plan: TSH, Comprehensive metabolic panel  Moderate mixed hyperlipidemia not requiring statin therapy  History of multiple concussions  Screening for colon cancer - Plan: Ambulatory referral to Gastroenterology  Hypothyroidism Dose was increased in October to  75 mcg and needs repeat level checked.  Advised to suspend biotin (Prenatal MVI source) for 3 days prior to testing  Hyperlipidemia Managed with atorvastatin  .  No side effects.  LDL at goal  Lab Results  Component Value Date   CHOL 221 (H) 02/27/2019   HDL 69 02/27/2019   LDLCALC 114 (H) 02/27/2019   TRIG 220 (H) 02/27/2019   CHOLHDL 3.2 02/27/2019    Breast calcification, left Diagnostic and [place] ordered     I discussed the assessment and treatment plan with the patient. The patient was provided an opportunity to ask questions and all were answered. The patient agreed with the plan and demonstrated an understanding of the instructions.   The patient was advised to call back or seek an in-person evaluation if the symptoms worsen or if the condition fails to improve as anticipated     Crecencio Mc, MD

## 2019-06-05 NOTE — Assessment & Plan Note (Signed)
Managed with atorvastatin  .  No side effects.  LDL at goal  Lab Results  Component Value Date   CHOL 221 (H) 02/27/2019   HDL 69 02/27/2019   LDLCALC 114 (H) 02/27/2019   TRIG 220 (H) 02/27/2019   CHOLHDL 3.2 02/27/2019

## 2019-06-05 NOTE — Assessment & Plan Note (Signed)
Dose was increased in October to 75 mcg and needs repeat level checked.  Advised to suspend biotin (Prenatal MVI source) for 3 days prior to testing

## 2019-06-05 NOTE — Assessment & Plan Note (Signed)
Diagnostic and [place] ordered

## 2019-06-06 ENCOUNTER — Other Ambulatory Visit: Payer: Self-pay

## 2019-06-06 DIAGNOSIS — Z1211 Encounter for screening for malignant neoplasm of colon: Secondary | ICD-10-CM

## 2019-06-08 ENCOUNTER — Ambulatory Visit
Admission: RE | Admit: 2019-06-08 | Discharge: 2019-06-08 | Disposition: A | Discharge status: Home / Self Care | Payer: Medicare Other | Source: Ambulatory Visit | Attending: Family Medicine | Admitting: Family Medicine

## 2019-06-08 ENCOUNTER — Other Ambulatory Visit
Admission: RE | Admit: 2019-06-08 | Discharge: 2019-06-08 | Disposition: A | Discharge status: Home / Self Care | Payer: Medicare Other | Source: Ambulatory Visit | Attending: Gastroenterology | Admitting: Gastroenterology

## 2019-06-08 DIAGNOSIS — Z1231 Encounter for screening mammogram for malignant neoplasm of breast: Secondary | ICD-10-CM | POA: Insufficient documentation

## 2019-06-08 DIAGNOSIS — Z20822 Contact with and (suspected) exposure to covid-19: Secondary | ICD-10-CM | POA: Diagnosis not present

## 2019-06-08 DIAGNOSIS — R928 Other abnormal and inconclusive findings on diagnostic imaging of breast: Secondary | ICD-10-CM

## 2019-06-08 DIAGNOSIS — Z01812 Encounter for preprocedural laboratory examination: Secondary | ICD-10-CM | POA: Insufficient documentation

## 2019-06-08 DIAGNOSIS — R921 Mammographic calcification found on diagnostic imaging of breast: Secondary | ICD-10-CM | POA: Diagnosis not present

## 2019-06-08 DIAGNOSIS — R922 Inconclusive mammogram: Secondary | ICD-10-CM | POA: Diagnosis not present

## 2019-06-08 LAB — SARS CORONAVIRUS 2 (TAT 6-24 HRS): SARS Coronavirus 2: NEGATIVE

## 2019-06-09 ENCOUNTER — Encounter: Payer: Self-pay | Admitting: Gastroenterology

## 2019-06-12 ENCOUNTER — Encounter: Payer: Self-pay | Admitting: Gastroenterology

## 2019-06-12 ENCOUNTER — Encounter
Admission: RE | Disposition: A | Discharge status: Home / Self Care | Payer: Self-pay | Source: Home / Self Care | Attending: Gastroenterology

## 2019-06-12 ENCOUNTER — Ambulatory Visit: Payer: Medicare Other | Admitting: Family

## 2019-06-12 ENCOUNTER — Other Ambulatory Visit: Payer: Medicare Other

## 2019-06-12 ENCOUNTER — Ambulatory Visit
Admission: RE | Admit: 2019-06-12 | Discharge: 2019-06-12 | Disposition: A | Discharge status: Home / Self Care | Payer: Medicare Other | Attending: Gastroenterology | Admitting: Gastroenterology

## 2019-06-12 ENCOUNTER — Other Ambulatory Visit: Payer: Self-pay

## 2019-06-12 DIAGNOSIS — D122 Benign neoplasm of ascending colon: Secondary | ICD-10-CM | POA: Diagnosis not present

## 2019-06-12 DIAGNOSIS — E039 Hypothyroidism, unspecified: Secondary | ICD-10-CM | POA: Insufficient documentation

## 2019-06-12 DIAGNOSIS — Z8249 Family history of ischemic heart disease and other diseases of the circulatory system: Secondary | ICD-10-CM | POA: Diagnosis not present

## 2019-06-12 DIAGNOSIS — Z79899 Other long term (current) drug therapy: Secondary | ICD-10-CM | POA: Diagnosis not present

## 2019-06-12 DIAGNOSIS — Z803 Family history of malignant neoplasm of breast: Secondary | ICD-10-CM | POA: Diagnosis not present

## 2019-06-12 DIAGNOSIS — D124 Benign neoplasm of descending colon: Secondary | ICD-10-CM | POA: Diagnosis not present

## 2019-06-12 DIAGNOSIS — K621 Rectal polyp: Secondary | ICD-10-CM | POA: Diagnosis not present

## 2019-06-12 DIAGNOSIS — D125 Benign neoplasm of sigmoid colon: Secondary | ICD-10-CM | POA: Diagnosis not present

## 2019-06-12 DIAGNOSIS — D128 Benign neoplasm of rectum: Secondary | ICD-10-CM | POA: Diagnosis not present

## 2019-06-12 DIAGNOSIS — Z8349 Family history of other endocrine, nutritional and metabolic diseases: Secondary | ICD-10-CM | POA: Insufficient documentation

## 2019-06-12 DIAGNOSIS — K635 Polyp of colon: Secondary | ICD-10-CM | POA: Diagnosis not present

## 2019-06-12 DIAGNOSIS — Z833 Family history of diabetes mellitus: Secondary | ICD-10-CM | POA: Diagnosis not present

## 2019-06-12 DIAGNOSIS — Z1211 Encounter for screening for malignant neoplasm of colon: Secondary | ICD-10-CM | POA: Diagnosis not present

## 2019-06-12 DIAGNOSIS — E785 Hyperlipidemia, unspecified: Secondary | ICD-10-CM | POA: Insufficient documentation

## 2019-06-12 HISTORY — DX: Hypothyroidism, unspecified: E03.9

## 2019-06-12 HISTORY — PX: COLONOSCOPY WITH PROPOFOL: SHX5780

## 2019-06-12 SURGERY — COLONOSCOPY WITH PROPOFOL
Anesthesia: General

## 2019-06-12 MED ORDER — SODIUM CHLORIDE 0.9 % IV SOLN: INTRAVENOUS | Status: DC

## 2019-06-12 MED ORDER — PROPOFOL 500 MG/50ML IV EMUL
INTRAVENOUS | Status: AC
  Filled 2019-06-12: qty 50

## 2019-06-12 MED ORDER — PROPOFOL 500 MG/50ML IV EMUL
INTRAVENOUS | Status: DC | PRN
  Administered 2019-06-12: 150 ug/kg/min via INTRAVENOUS
  Administered 2019-06-12: 50 mg via INTRAVENOUS

## 2019-06-12 NOTE — H&P (Signed)
Cephas Darby, MD [address]  [place]  [place], [place] [address]  Main: [phone]  Fax: [phone] Pager: [phone]  Primary Care Physician:  Crecencio Mc, MD Primary Gastroenterologist:  Dr. Cephas Darby  Pre-Procedure History & Physical: HPI:  Sheri Arnold is a 69 y.o. female is here for an colonoscopy.   Past Medical History:  Diagnosis Date  . Hx of dysplastic nevus 08/22/2010   mid abdomen  . Hyperlipidemia   . Hypothyroidism     Past Surgical History:  Procedure Laterality Date  . COLONOSCOPY    . EYE SURGERY    . Tummy Tuck  2010    Prior to Admission medications   Medication Sig Start Date End Date Taking? Authorizing Provider  atorvastatin (LIPITOR) 40 MG tablet Take 1 tablet (40 mg total) by mouth daily. 02/27/19   Guadalupe Maple, MD  betamethasone valerate (VALISONE) 0.1 % cream Apply topically daily. 02/27/19   Guadalupe Maple, MD  levothyroxine (SYNTHROID) 75 MCG tablet Take 1 tablet (75 mcg total) by mouth daily. 03/07/19   Guadalupe Maple, MD    Allergies as of 06/06/2019  . (No Known Allergies)    Family History  Problem Relation Age of Onset  . Diabetes Mother   . Heart disease Father   . Breast cancer Maternal Aunt   . Hyperlipidemia Sister   . Hyperlipidemia Brother   . Breast cancer Cousin     Social History   Socioeconomic History  . Marital status: Widowed    Spouse name: Not on file  . Number of children: Not on file  . Years of education: Not on file  . Highest education level: Not on file  Occupational History  . Not on file  Tobacco Use  . Smoking status: Never Smoker  . Smokeless tobacco: Never Used  Substance and Sexual Activity  . Alcohol use: Yes    Alcohol/week: 7.0 standard drinks    Types: 7 Shots of liquor per week  . Drug use: No  . Sexual activity: Not on file  Other Topics Concern  . Not on file  Social History Narrative  . Not on file   Social Determinants of Health   Financial  Resource Strain:   . Difficulty of Paying Living Expenses: Not on file  Food Insecurity:   . Worried About Charity fundraiser in the Last Year: Not on file  . Ran Out of Food in the Last Year: Not on file  Transportation Needs:   . Lack of Transportation (Medical): Not on file  . Lack of Transportation (Non-Medical): Not on file  Physical Activity:   . Days of Exercise per Week: Not on file  . Minutes of Exercise per Session: Not on file  Stress:   . Feeling of Stress : Not on file  Social Connections:   . Frequency of Communication with Friends and Family: Not on file  . Frequency of Social Gatherings with Friends and Family: Not on file  . Attends Religious Services: Not on file  . Active Member of Clubs or Organizations: Not on file  . Attends Archivist Meetings: Not on file  . Marital Status: Not on file  Intimate Partner Violence:   . Fear of Current or Ex-Partner: Not on file  . Emotionally Abused: Not on file  . Physically Abused: Not on file  . Sexually Abused: Not on file    Review of Systems: See HPI, otherwise negative ROS  Physical Exam: BP 131/76   Pulse 71   Temp (!) 96.8 F (36 C)   Resp 18   Ht 5\' 6"  (1.676 m)   Wt 88 kg   SpO2 99%   BMI 31.31 kg/m  General:   Alert,  pleasant and cooperative in NAD Head:  Normocephalic and atraumatic. Neck:  Supple; no masses or thyromegaly. Lungs:  Clear throughout to auscultation.    Heart:  Regular rate and rhythm. Abdomen:  Soft, nontender and nondistended. Normal bowel sounds, without guarding, and without rebound.   Neurologic:  Alert and  oriented x4;  grossly normal neurologically.  Impression/Plan: Sheri Arnold is here for an colonoscopy to be performed for colon cancer screening  Risks, benefits, limitations, and alternatives regarding  colonoscopy have been reviewed with the patient.  Questions have been answered.  All parties agreeable.   Sherri Sear, MD  06/12/2019, 10:22 AM

## 2019-06-12 NOTE — Anesthesia Preprocedure Evaluation (Signed)
Anesthesia Evaluation  Patient identified by MRN, date of birth, ID band Patient awake    Reviewed: Allergy & Precautions, H&P , NPO status , Patient's Chart, lab work & pertinent test results  History of Anesthesia Complications Negative for: history of anesthetic complications  Airway Mallampati: III  TM Distance: >3 FB Neck ROM: full    Dental  (+) Chipped   Pulmonary neg pulmonary ROS, neg shortness of breath,           Cardiovascular Exercise Tolerance: Good (-) angina(-) Past MI and (-) DOE negative cardio ROS       Neuro/Psych negative neurological ROS  negative psych ROS   GI/Hepatic negative GI ROS, Neg liver ROS,   Endo/Other  Hypothyroidism   Renal/GU negative Renal ROS  negative genitourinary   Musculoskeletal   Abdominal   Peds  Hematology negative hematology ROS (+)   Anesthesia Other Findings Past Medical History: 08/22/2010: Hx of dysplastic nevus     Comment:  mid abdomen No date: Hyperlipidemia No date: Hypothyroidism  Past Surgical History: No date: COLONOSCOPY No date: EYE SURGERY 2010: Tummy Tuck  BMI    Body Mass Index: 31.31 kg/m      Reproductive/Obstetrics negative OB ROS                             Anesthesia Physical Anesthesia Plan  ASA: II  Anesthesia Plan: General   Post-op Pain Management:    Induction: Intravenous  PONV Risk Score and Plan: Propofol infusion and TIVA  Airway Management Planned: Natural Airway and Nasal Cannula  Additional Equipment:   Intra-op Plan:   Post-operative Plan:   Informed Consent: I have reviewed the patients History and Physical, chart, labs and discussed the procedure including the risks, benefits and alternatives for the proposed anesthesia with the patient or authorized representative who has indicated his/her understanding and acceptance.     Dental Advisory Given  Plan Discussed with:  Anesthesiologist, CRNA and Surgeon  Anesthesia Plan Comments: (Patient consented for risks of anesthesia including but not limited to:  - adverse reactions to medications - risk of intubation if required - damage to teeth, lips or other oral mucosa - sore throat or hoarseness - Damage to heart, brain, lungs or loss of life  Patient voiced understanding.)        Anesthesia Quick Evaluation

## 2019-06-12 NOTE — Op Note (Signed)
[facility] Gastroenterology Patient Name: Sheri Arnold Procedure Date: 06/12/2019 10:33 AM MRN: [id number] Account #: [id number] Date of Birth: [birth year] Admit Type: Outpatient Age: 69 Room: [facility] ENDO ROOM 2 Gender: Female Note Status: Finalized Procedure:             Colonoscopy Indications:           Screening for colorectal malignant neoplasm, Last                         colonoscopy: August 2009 Providers:             Lin Landsman MD, MD Referring MD:          Deborra Medina, MD (Referring MD) Medicines:             Monitored Anesthesia Care Complications:         No immediate complications. Estimated blood loss: None. Procedure:             Pre-Anesthesia Assessment:                        - Prior to the procedure, a History and Physical was                         performed, and patient medications and allergies were                         reviewed. The patient is competent. The risks and                         benefits of the procedure and the sedation options and                         risks were discussed with the patient. All questions                         were answered and informed consent was obtained.                         Patient identification and proposed procedure were                         verified by the physician, the nurse, the                         anesthesiologist, the anesthetist and the technician                         in the pre-procedure area in the procedure room in the                         endoscopy suite. Mental Status Examination: alert and                         oriented. Airway Examination: normal oropharyngeal                         airway and neck mobility. Respiratory Examination:  clear to auscultation. CV Examination: normal.                         Prophylactic Antibiotics: The patient does not require                         prophylactic antibiotics. Prior Anticoagulants: The                       patient has taken no previous anticoagulant or                         antiplatelet agents. ASA Grade Assessment: II - A                         patient with mild systemic disease. After reviewing                         the risks and benefits, the patient was deemed in                         satisfactory condition to undergo the procedure. The                         anesthesia plan was to use monitored anesthesia care                         (MAC). Immediately prior to administration of                         medications, the patient was re-assessed for adequacy                         to receive sedatives. The heart rate, respiratory                         rate, oxygen saturations, blood pressure, adequacy of                         pulmonary ventilation, and response to care were                         monitored throughout the procedure. The physical                         status of the patient was re-assessed after the                         procedure.                        After obtaining informed consent, the colonoscope was                         passed under direct vision. Throughout the procedure,                         the patient's blood pressure, pulse, and oxygen  saturations were monitored continuously. The                         Colonoscope was introduced through the anus and                         advanced to the the terminal ileum, with                         identification of the appendiceal orifice and IC                         valve. The colonoscopy was performed without                         difficulty. The patient tolerated the procedure well.                         The quality of the bowel preparation was evaluated                         using the BBPS [facility] Bowel Preparation Scale) with                         scores of: Right Colon = 3, Transverse Colon = 3 and                         Left Colon = 3  (entire mucosa seen well with no                         residual staining, small fragments of stool or opaque                         liquid). The total BBPS score equals 9. Findings:      A 5 mm polyp was found in the ascending colon. The polyp was sessile.       The polyp was removed with a cold snare. Resection and retrieval were       complete.      A 7 mm polyp was found in the ascending colon. The polyp was flat. The       polyp was removed with a saline injection-lift technique using a cold       snare. Resection and retrieval were complete.      A 6 mm polyp was found in the descending colon. The polyp was flat.       Preparations were made for mucosal resection. Saline was injected to       raise the lesion. Snare mucosal resection was performed. Resection and       retrieval were complete.      A 8 mm polyp was found in the sigmoid colon. The polyp was sessile. The       polyp was removed with a hot snare. Resection and retrieval were       complete.      Two sessile polyps were found in the rectum. The polyps were 5 mm in       size. These polyps were removed with a cold snare. Resection and       retrieval were complete. Estimated  blood loss: none.      The retroflexed view of the distal rectum and anal verge was normal and       showed no anal or rectal abnormalities. Impression:            - One 5 mm polyp in the ascending colon, removed with                         a cold snare. Resected and retrieved.                        - One 7 mm polyp in the ascending colon, removed using                         injection-lift and a cold snare. Resected and                         retrieved.                        - One 6 mm polyp in the descending colon, removed with                         mucosal resection. Resected and retrieved.                        - One 8 mm polyp in the sigmoid colon, removed with a                         hot snare. Resected and retrieved.                         - Two 5 mm polyps in the rectum, removed with a cold                         snare. Resected and retrieved.                        - The distal rectum and anal verge are normal on                         retroflexion view.                        - Mucosal resection was performed. Resection and                         retrieval were complete. Recommendation:        - Discharge patient to home (with escort).                        - Resume previous diet today.                        - Continue present medications.                        - Await pathology results.                        -  Repeat colonoscopy in 3 years for surveillance of                         multiple polyps. Procedure Code(s):     --- Professional ---                        [phone], Colonoscopy, flexible; with endoscopic mucosal                         resection                        45385, 60, Colonoscopy, flexible; with removal of                         tumor(s), polyp(s), or other lesion(s) by snare                         technique                        45381, 30, Colonoscopy, flexible; with directed                         submucosal injection(s), any substance Diagnosis Code(s):     --- Professional ---                        Z12.11, Encounter for screening for malignant neoplasm                         of colon                        K63.5, Polyp of colon                        K62.1, Rectal polyp CPT copyright 2019 [employer]. All rights reserved. The codes documented in this report are preliminary and upon coder review may  be revised to meet current compliance requirements. Dr. Ulyess Mort Lin Landsman MD, MD 06/12/2019 11:10:59 AM This report has been signed electronically. Number of Addenda: 0 Note Initiated On: 06/12/2019 10:33 AM Scope Withdrawal Time: 0 hours 22 minutes 51 seconds  Total Procedure Duration: 0 hours 25 minutes 51 seconds  Estimated Blood Loss:   Estimated blood loss: none.      [facility]

## 2019-06-12 NOTE — Transfer of Care (Signed)
Immediate Anesthesia Transfer of Care Note  Patient: Sheri Arnold  Procedure(s) Performed: COLONOSCOPY WITH PROPOFOL (N/A )  Patient Location: PACU  Anesthesia Type:General  Level of Consciousness: awake, alert  and oriented  Airway & Oxygen Therapy: Patient Spontanous Breathing  Post-op Assessment: Report given to RN and Post -op Vital signs reviewed and stable  Post vital signs: Reviewed and stable  Last Vitals:  Vitals Value Taken Time  BP 102/65 06/12/19 1112  Temp    Pulse 76 06/12/19 1113  Resp 16 06/12/19 1113  SpO2 97 % 06/12/19 1113  Vitals shown include unvalidated device data.  Last Pain:  Vitals:   06/12/19 0950  PainSc: 0-No pain         Complications: No apparent anesthesia complications

## 2019-06-13 ENCOUNTER — Other Ambulatory Visit: Payer: Self-pay

## 2019-06-13 ENCOUNTER — Encounter: Payer: Self-pay | Admitting: *Deleted

## 2019-06-13 LAB — SURGICAL PATHOLOGY

## 2019-06-13 NOTE — Anesthesia Postprocedure Evaluation (Signed)
Anesthesia Post Note  Patient: Sheri Arnold  Procedure(s) Performed: COLONOSCOPY WITH PROPOFOL (N/A )  Patient location during evaluation: Endoscopy Anesthesia Type: General Level of consciousness: awake and alert Pain management: pain level controlled Vital Signs Assessment: post-procedure vital signs reviewed and stable Respiratory status: spontaneous breathing, nonlabored ventilation, respiratory function stable and patient connected to nasal cannula oxygen Cardiovascular status: blood pressure returned to baseline and stable Postop Assessment: no apparent nausea or vomiting Anesthetic complications: no     Last Vitals:  Vitals:   06/12/19 1110 06/12/19 1120  BP: 102/65 (!) 108/59  Pulse: 77 74  Resp: 15 15  Temp: (!) 36 C   SpO2: 98% 97%    Last Pain:  Vitals:   06/12/19 0950  PainSc: 0-No pain                 Sheri Arnold

## 2019-06-14 ENCOUNTER — Other Ambulatory Visit (INDEPENDENT_AMBULATORY_CARE_PROVIDER_SITE_OTHER): Payer: Medicare Other

## 2019-06-14 ENCOUNTER — Encounter: Payer: Self-pay | Admitting: Gastroenterology

## 2019-06-14 DIAGNOSIS — E039 Hypothyroidism, unspecified: Secondary | ICD-10-CM | POA: Diagnosis not present

## 2019-06-14 LAB — COMPREHENSIVE METABOLIC PANEL
ALT: 26 U/L (ref 0–35)
AST: 18 U/L (ref 0–37)
Albumin: 4.2 g/dL (ref 3.5–5.2)
Alkaline Phosphatase: 91 U/L (ref 39–117)
BUN: 14 mg/dL (ref 6–23)
CO2: 26 mEq/L (ref 19–32)
Calcium: 9.7 mg/dL (ref 8.4–10.5)
Chloride: 102 mEq/L (ref 96–112)
Creatinine, Ser: 0.73 mg/dL (ref 0.40–1.20)
GFR: 79.68 mL/min (ref >60.00)
Glucose, Bld: 140 mg/dL — ABNORMAL HIGH (ref 70–99)
Potassium: 4 mEq/L (ref 3.5–5.1)
Sodium: 136 mEq/L (ref 135–145)
Total Bilirubin: 0.3 mg/dL (ref 0.2–1.2)
Total Protein: 7.6 g/dL (ref 6.0–8.3)

## 2019-06-14 LAB — TSH: TSH: 1.32 u[IU]/mL (ref 0.35–4.50)

## 2019-06-19 MED ORDER — LEVOTHYROXINE SODIUM 75 MCG PO TABS: 75.0000 ug | ORAL_TABLET | Freq: Every day | ORAL | 1 refills | Status: DC

## 2019-08-21 ENCOUNTER — Other Ambulatory Visit: Payer: Self-pay

## 2019-08-21 ENCOUNTER — Ambulatory Visit (INDEPENDENT_AMBULATORY_CARE_PROVIDER_SITE_OTHER): Payer: Medicare Other

## 2019-08-21 VITALS — Ht 66.0 in | Wt 200.0 lb

## 2019-08-21 DIAGNOSIS — Z0000 Encounter for general adult medical examination without abnormal findings: Secondary | ICD-10-CM

## 2019-08-21 NOTE — Progress Notes (Addendum)
Subjective:   Sheri Arnold is a 69 y.o. female who presents for an Initial Medicare Annual Wellness Visit.  Review of Systems    No ROS.  Medicare Wellness Virtual Visit.  Visual/audio telehealth visit, UTA vital signs.   Wt/Ht provided.  See social history for additional risk factors.    Cardiac Risk Factors include: advanced age (>42mn, >>31women)     Objective:    Today's Vitals   08/21/19 1235  Weight: 200 lb (90.7 kg)  Height: '5\' 6"'$  (1.676 m)   Body mass index is 32.28 kg/m.  Advanced Directives 08/21/2019 06/12/2019  Does Patient Have a Medical Advance Directive? Yes Yes  Type of AParamed[place]of [place]ing [place];Out of facility DNR (pink MOST or yellow form)  Does patient want to make changes to medical advance directive? No - Patient declined -  Copy of [place] Chart? No - copy requested Yes - validated most recent copy scanned in chart (See row information)    Current Medications (verified) Outpatient Encounter Medications as of 08/21/2019  Medication Sig  . atorvastatin (LIPITOR) 40 MG tablet Take 1 tablet (40 mg total) by mouth daily.  . betamethasone valerate (VALISONE) 0.1 % cream Apply topically daily.  .Marland Kitchenlevothyroxine (SYNTHROID) 75 MCG tablet Take 1 tablet (75 mcg total) by mouth daily.   No facility-administered encounter medications on file as of 08/21/2019.    Allergies (verified) Patient has no known allergies.   History: Past Medical History:  Diagnosis Date  . Hx of dysplastic nevus 08/22/2010   mid abdomen  . Hyperlipidemia   . Hypothyroidism    Past Surgical History:  Procedure Laterality Date  . COLONOSCOPY    . COLONOSCOPY WITH PROPOFOL N/A 06/12/2019   Procedure: COLONOSCOPY WITH PROPOFOL;  Surgeon: VLin Landsman MD;  [facility];  Service: Gastroenterology;  Laterality: N/A;  . EYE SURGERY    . Tummy Tuck  2010   Family  History  Problem Relation Age of Onset  . Diabetes Mother   . Heart disease Father   . Breast cancer Maternal Aunt   . Hyperlipidemia Sister   . Hyperlipidemia Brother   . Breast cancer Cousin    Social History   Socioeconomic History  . Marital status: Widowed    Spouse name: Not on file  . Number of children: Not on file  . Years of education: Not on file  . Highest education level: Not on file  Occupational History  . Not on file  Tobacco Use  . Smoking status: Never Smoker  . Smokeless tobacco: Never Used  Substance and Sexual Activity  . Alcohol use: Yes    Alcohol/week: 7.0 standard drinks    Types: 7 Shots of liquor per week  . Drug use: No  . Sexual activity: Not on file  Other Topics Concern  . Not on file  Social History Narrative  . Not on file   Social Determinants of Health   Financial Resource Strain: Low Risk   . Difficulty of Paying Living Expenses: Not hard at all  Food Insecurity: No Food Insecurity  . Worried About RCharity fundraiserin the Last Year: Never true  . Ran Out of Food in the Last Year: Never true  Transportation Needs: No Transportation Needs  . Lack of Transportation (Medical): No  . Lack of Transportation (Non-Medical): No  Physical Activity: Sufficiently Active  . Days of Exercise per Week:  7 days  . Minutes of Exercise per Session: 30 min  Stress: No Stress Concern Present  . Feeling of Stress : Not at all  Social Connections: Slightly Isolated  . Frequency of Communication with Friends and Family: More than three times a week  . Frequency of Social Gatherings with Friends and Family: More than three times a week  . Attends Religious Services: More than 4 times per year  . Active Member of Clubs or Organizations: Yes  . Attends Archivist Meetings: More than 4 times per year  . Marital Status: Widowed    Tobacco Counseling Counseling given: Not Answered   Clinical Intake:                         Activities of Daily Living In your present state of health, do you have any difficulty performing the following activities: 08/21/2019  Hearing? N  Vision? N  Difficulty concentrating or making decisions? N  Walking or climbing stairs? N  Dressing or bathing? N  Doing errands, shopping? N  Preparing Food and eating ? N  Using the Toilet? N  In the past six months, have you accidently leaked urine? N  Do you have problems with loss of bowel control? N  Managing your Medications? N  Managing your Finances? N  Housekeeping or managing your Housekeeping? N  Some recent data might be hidden     Immunizations and Health Maintenance Immunization History  Administered Date(s) Administered  . Influenza,inj,Quad PF,6+ Mos 02/16/2017  . Influenza-Unspecified 03/05/2015, 03/25/2016  . MMR 11/22/2018, 03/06/2019  . PFIZER SARS-COV-2 Vaccination 07/08/2019, 07/31/2019  . Td 01/14/2007, 02/23/2017   There are no preventive care reminders to display for this patient.  Patient Care Team: Crecencio Mc, MD as PCP - General (Internal Medicine)  Indicate any recent Medical Services you may have received from other than [facility] providers in the past year (date may be approximate).     Assessment:   This is a routine wellness examination for [place].  Nurse connected with patient 08/21/19 at 12:30 PM EDT by a telephone enabled telemedicine application and verified that I am speaking with the correct person using two identifiers. Patient stated full name and DOB. Patient gave permission to continue with virtual visit. Patient's location was at home and Nurse's location was at [place] office.   Patient is alert and oriented x3. Patient denies difficulty focusing or concentrating. Patient likes to read for brain health.   Health Maintenance Due: -PNA and Shingles vaccine- discussed; to be completed with doctor in visit or local pharmacy at least 4 weeks after completion of covid vaccines.   -Dexa Scan- declined.  See completed HM at the end of note.   Eye: Visual acuity not assessed. Virtual visit. Followed by their ophthalmologist.  Dental: Visits every 6 months.    Hearing: Demonstrates normal hearing during visit.  Safety:  Patient feels safe at home- yes Patient does have smoke detectors at home- yes Patient does wear sunscreen or protective clothing when in direct sunlight - yes Patient does wear seat belt when in a moving vehicle - yes Patient drives- yes Adequate lighting in walkways free from debris- yes Grab bars and handrails used as appropriate- yes Ambulates with an assistive device- no Cell phone on person when ambulating outside of the home- yes  Social: Alcohol intake - yes      Smoking history- never   Smokers in home? none Illicit drug use?  none  Medication: Taking as directed and without issues.  Self managed - yes   Covid-19: Precautions and sickness symptoms discussed. Wears mask, social distancing, hand hygiene as appropriate.   Activities of Daily Living Patient denies needing assistance with: household chores, feeding themselves, getting from bed to chair, getting to the toilet, bathing/showering, dressing, managing money, or preparing meals.   Discussed the importance of a healthy diet, water intake and the benefits of aerobic exercise.   Physical activity- walking 2 miles daily, pilates, balance exercises.  Diet:  Regular Water: 64 ounces  Other Providers Patient Care Team: Crecencio Mc, MD as PCP - General (Internal Medicine)  Hearing/Vision screen  Hearing Screening   '125Hz'$  '250Hz'$  '500Hz'$  '1000Hz'$  '2000Hz'$  '3000Hz'$  '4000Hz'$  '6000Hz'$  '8000Hz'$   Right ear:           Left ear:           Comments: Patient is able to hear conversational tones without difficulty.  No issues reported.  Vision Screening Comments: Visual acuity not assessed, virtual visit.  They have seen their ophthalmologist in the last 12 months.    Dietary issues  and exercise activities discussed: Current Exercise Habits: Home exercise routine, Type of exercise: walking, Time (Minutes): 45, Frequency (Times/Week): 7, Weekly Exercise (Minutes/Week): 315, Intensity: Mild  Goals      Patient Stated   . I plan to use walking sticks with 2lb weights for assisitance with balance during exercise. (pt-stated)      Depression Screen PHQ 2/9 Scores 08/21/2019 06/05/2019 02/22/2018 06/03/2017 06/01/2016  PHQ - 2 Score 0 0 0 0 0  PHQ- 9 Score - 0 0 - -    Fall Risk Fall Risk  08/21/2019 06/05/2019 02/22/2018 06/03/2017 06/01/2016  Falls in the past year? 0 0 No No Yes  Number falls in past yr: - - - - 1  Injury with Fall? - - - - No  Follow up Falls prevention discussed;Falls evaluation completed Falls evaluation completed - - -   Timed Get Up and Go Performed no, virtual visit  Cognitive Function:     6CIT Screen 08/21/2019  What Year? 0 points  What month? 0 points  What time? 0 points    Screening Tests Health Maintenance  Topic Date Due  . INFLUENZA VACCINE  08/23/2019 (Originally 12/24/2018)  . DEXA SCAN  08/20/2020 (Originally 02/06/2018)  . PNA vac Low Risk Adult (1 of 2 - PCV13) 08/20/2020 (Originally 02/06/2018)  . MAMMOGRAM  06/07/2021  . COLONOSCOPY  06/11/2022  . TETANUS/TDAP  02/24/2027  . Hepatitis C Screening  Completed     Plan:   Keep all routine maintenance appointments.   Medicare Attestation I have personally reviewed: The patient's medical and social history Their use of alcohol, tobacco or illicit drugs Their current medications and supplements The patient's functional ability including ADLs,fall risks, home safety risks, cognitive, and hearing and visual impairment Diet and physical activities Evidence for depression   I have reviewed and discussed with patient certain preventive protocols, quality metrics, and best practice recommendations.   OBrien-Blaney, Lashawna Poche L, LPN   7/56/4332     I have reviewed the above  information and agree with above.   Deborra Medina, MD

## 2019-08-21 NOTE — Patient Instructions (Addendum)
  Sheri Arnold , Thank you for taking time to come for your Medicare Wellness Visit. I appreciate your ongoing commitment to your health goals. Please review the following plan we discussed and let me know if I can assist you in the future.   These are the goals we discussed: Goals      Patient Stated   . I plan to use walking sticks with 2lb weights for assisitance with balance during exercise. (pt-stated)       This is a list of the screening recommended for you and due dates:  Health Maintenance  Topic Date Due  . Flu Shot  08/23/2019*  . DEXA scan (bone density measurement)  08/20/2020*  . Pneumonia vaccines (1 of 2 - PCV13) 08/20/2020*  . Mammogram  06/07/2021  . Colon Cancer Screening  06/11/2022  . Tetanus Vaccine  02/24/2027  .  Hepatitis C: One time screening is recommended by Center for Disease Control  (CDC) for  adults born from [birth year] through 1963.   Completed  *Topic was postponed. The date shown is not the original due date.

## 2019-09-13 DIAGNOSIS — M545 Low back pain: Secondary | ICD-10-CM | POA: Diagnosis not present

## 2019-09-13 DIAGNOSIS — M5137 Other intervertebral disc degeneration, lumbosacral region: Secondary | ICD-10-CM | POA: Diagnosis not present

## 2019-09-13 DIAGNOSIS — M9901 Segmental and somatic dysfunction of cervical region: Secondary | ICD-10-CM | POA: Diagnosis not present

## 2019-09-13 DIAGNOSIS — M9903 Segmental and somatic dysfunction of lumbar region: Secondary | ICD-10-CM | POA: Diagnosis not present

## 2019-09-14 DIAGNOSIS — M545 Low back pain: Secondary | ICD-10-CM | POA: Diagnosis not present

## 2019-09-14 DIAGNOSIS — M5137 Other intervertebral disc degeneration, lumbosacral region: Secondary | ICD-10-CM | POA: Diagnosis not present

## 2019-09-14 DIAGNOSIS — M9903 Segmental and somatic dysfunction of lumbar region: Secondary | ICD-10-CM | POA: Diagnosis not present

## 2019-09-14 DIAGNOSIS — M9901 Segmental and somatic dysfunction of cervical region: Secondary | ICD-10-CM | POA: Diagnosis not present

## 2019-09-18 DIAGNOSIS — M5137 Other intervertebral disc degeneration, lumbosacral region: Secondary | ICD-10-CM | POA: Diagnosis not present

## 2019-09-18 DIAGNOSIS — M545 Low back pain: Secondary | ICD-10-CM | POA: Diagnosis not present

## 2019-09-18 DIAGNOSIS — M9903 Segmental and somatic dysfunction of lumbar region: Secondary | ICD-10-CM | POA: Diagnosis not present

## 2019-09-18 DIAGNOSIS — M9901 Segmental and somatic dysfunction of cervical region: Secondary | ICD-10-CM | POA: Diagnosis not present

## 2019-09-19 DIAGNOSIS — M545 Low back pain: Secondary | ICD-10-CM | POA: Diagnosis not present

## 2019-09-19 DIAGNOSIS — M9901 Segmental and somatic dysfunction of cervical region: Secondary | ICD-10-CM | POA: Diagnosis not present

## 2019-09-19 DIAGNOSIS — M9903 Segmental and somatic dysfunction of lumbar region: Secondary | ICD-10-CM | POA: Diagnosis not present

## 2019-09-19 DIAGNOSIS — M5137 Other intervertebral disc degeneration, lumbosacral region: Secondary | ICD-10-CM | POA: Diagnosis not present

## 2019-09-21 DIAGNOSIS — M5137 Other intervertebral disc degeneration, lumbosacral region: Secondary | ICD-10-CM | POA: Diagnosis not present

## 2019-09-21 DIAGNOSIS — M9903 Segmental and somatic dysfunction of lumbar region: Secondary | ICD-10-CM | POA: Diagnosis not present

## 2019-09-21 DIAGNOSIS — M9901 Segmental and somatic dysfunction of cervical region: Secondary | ICD-10-CM | POA: Diagnosis not present

## 2019-09-21 DIAGNOSIS — M545 Low back pain: Secondary | ICD-10-CM | POA: Diagnosis not present

## 2019-09-25 DIAGNOSIS — M5137 Other intervertebral disc degeneration, lumbosacral region: Secondary | ICD-10-CM | POA: Diagnosis not present

## 2019-09-25 DIAGNOSIS — M545 Low back pain: Secondary | ICD-10-CM | POA: Diagnosis not present

## 2019-09-25 DIAGNOSIS — M9901 Segmental and somatic dysfunction of cervical region: Secondary | ICD-10-CM | POA: Diagnosis not present

## 2019-09-25 DIAGNOSIS — M9903 Segmental and somatic dysfunction of lumbar region: Secondary | ICD-10-CM | POA: Diagnosis not present

## 2019-09-27 DIAGNOSIS — M5137 Other intervertebral disc degeneration, lumbosacral region: Secondary | ICD-10-CM | POA: Diagnosis not present

## 2019-09-27 DIAGNOSIS — M9901 Segmental and somatic dysfunction of cervical region: Secondary | ICD-10-CM | POA: Diagnosis not present

## 2019-09-27 DIAGNOSIS — M9903 Segmental and somatic dysfunction of lumbar region: Secondary | ICD-10-CM | POA: Diagnosis not present

## 2019-09-27 DIAGNOSIS — M545 Low back pain: Secondary | ICD-10-CM | POA: Diagnosis not present

## 2019-09-28 DIAGNOSIS — M9903 Segmental and somatic dysfunction of lumbar region: Secondary | ICD-10-CM | POA: Diagnosis not present

## 2019-09-28 DIAGNOSIS — M5137 Other intervertebral disc degeneration, lumbosacral region: Secondary | ICD-10-CM | POA: Diagnosis not present

## 2019-09-28 DIAGNOSIS — M9901 Segmental and somatic dysfunction of cervical region: Secondary | ICD-10-CM | POA: Diagnosis not present

## 2019-09-28 DIAGNOSIS — M545 Low back pain: Secondary | ICD-10-CM | POA: Diagnosis not present

## 2019-10-02 DIAGNOSIS — M545 Low back pain: Secondary | ICD-10-CM | POA: Diagnosis not present

## 2019-10-02 DIAGNOSIS — M9901 Segmental and somatic dysfunction of cervical region: Secondary | ICD-10-CM | POA: Diagnosis not present

## 2019-10-02 DIAGNOSIS — M9903 Segmental and somatic dysfunction of lumbar region: Secondary | ICD-10-CM | POA: Diagnosis not present

## 2019-10-02 DIAGNOSIS — M5137 Other intervertebral disc degeneration, lumbosacral region: Secondary | ICD-10-CM | POA: Diagnosis not present

## 2019-10-04 DIAGNOSIS — M545 Low back pain: Secondary | ICD-10-CM | POA: Diagnosis not present

## 2019-10-04 DIAGNOSIS — M9901 Segmental and somatic dysfunction of cervical region: Secondary | ICD-10-CM | POA: Diagnosis not present

## 2019-10-04 DIAGNOSIS — M9903 Segmental and somatic dysfunction of lumbar region: Secondary | ICD-10-CM | POA: Diagnosis not present

## 2019-10-04 DIAGNOSIS — M5137 Other intervertebral disc degeneration, lumbosacral region: Secondary | ICD-10-CM | POA: Diagnosis not present

## 2019-10-05 DIAGNOSIS — M5137 Other intervertebral disc degeneration, lumbosacral region: Secondary | ICD-10-CM | POA: Diagnosis not present

## 2019-10-05 DIAGNOSIS — M9903 Segmental and somatic dysfunction of lumbar region: Secondary | ICD-10-CM | POA: Diagnosis not present

## 2019-10-05 DIAGNOSIS — M9901 Segmental and somatic dysfunction of cervical region: Secondary | ICD-10-CM | POA: Diagnosis not present

## 2019-10-05 DIAGNOSIS — M545 Low back pain: Secondary | ICD-10-CM | POA: Diagnosis not present

## 2019-10-09 DIAGNOSIS — M545 Low back pain: Secondary | ICD-10-CM | POA: Diagnosis not present

## 2019-10-09 DIAGNOSIS — M9901 Segmental and somatic dysfunction of cervical region: Secondary | ICD-10-CM | POA: Diagnosis not present

## 2019-10-09 DIAGNOSIS — M5137 Other intervertebral disc degeneration, lumbosacral region: Secondary | ICD-10-CM | POA: Diagnosis not present

## 2019-10-09 DIAGNOSIS — M9903 Segmental and somatic dysfunction of lumbar region: Secondary | ICD-10-CM | POA: Diagnosis not present

## 2019-10-16 DIAGNOSIS — M9901 Segmental and somatic dysfunction of cervical region: Secondary | ICD-10-CM | POA: Diagnosis not present

## 2019-10-16 DIAGNOSIS — M5137 Other intervertebral disc degeneration, lumbosacral region: Secondary | ICD-10-CM | POA: Diagnosis not present

## 2019-10-16 DIAGNOSIS — M9903 Segmental and somatic dysfunction of lumbar region: Secondary | ICD-10-CM | POA: Diagnosis not present

## 2019-10-16 DIAGNOSIS — M545 Low back pain: Secondary | ICD-10-CM | POA: Diagnosis not present

## 2019-10-18 DIAGNOSIS — M545 Low back pain: Secondary | ICD-10-CM | POA: Diagnosis not present

## 2019-10-18 DIAGNOSIS — M9903 Segmental and somatic dysfunction of lumbar region: Secondary | ICD-10-CM | POA: Diagnosis not present

## 2019-10-18 DIAGNOSIS — M5137 Other intervertebral disc degeneration, lumbosacral region: Secondary | ICD-10-CM | POA: Diagnosis not present

## 2019-10-18 DIAGNOSIS — M9901 Segmental and somatic dysfunction of cervical region: Secondary | ICD-10-CM | POA: Diagnosis not present

## 2019-10-19 DIAGNOSIS — M9901 Segmental and somatic dysfunction of cervical region: Secondary | ICD-10-CM | POA: Diagnosis not present

## 2019-10-19 DIAGNOSIS — M545 Low back pain: Secondary | ICD-10-CM | POA: Diagnosis not present

## 2019-10-19 DIAGNOSIS — M5137 Other intervertebral disc degeneration, lumbosacral region: Secondary | ICD-10-CM | POA: Diagnosis not present

## 2019-10-19 DIAGNOSIS — M9903 Segmental and somatic dysfunction of lumbar region: Secondary | ICD-10-CM | POA: Diagnosis not present

## 2019-10-26 ENCOUNTER — Other Ambulatory Visit: Payer: Self-pay

## 2019-10-26 DIAGNOSIS — M545 Low back pain: Secondary | ICD-10-CM | POA: Diagnosis not present

## 2019-10-26 DIAGNOSIS — M5137 Other intervertebral disc degeneration, lumbosacral region: Secondary | ICD-10-CM | POA: Diagnosis not present

## 2019-10-26 DIAGNOSIS — M9903 Segmental and somatic dysfunction of lumbar region: Secondary | ICD-10-CM | POA: Diagnosis not present

## 2019-10-26 DIAGNOSIS — M9901 Segmental and somatic dysfunction of cervical region: Secondary | ICD-10-CM | POA: Diagnosis not present

## 2019-10-30 ENCOUNTER — Ambulatory Visit: Payer: Medicare Other | Admitting: Internal Medicine

## 2019-10-30 DIAGNOSIS — M9901 Segmental and somatic dysfunction of cervical region: Secondary | ICD-10-CM | POA: Diagnosis not present

## 2019-10-30 DIAGNOSIS — M5137 Other intervertebral disc degeneration, lumbosacral region: Secondary | ICD-10-CM | POA: Diagnosis not present

## 2019-10-30 DIAGNOSIS — M9903 Segmental and somatic dysfunction of lumbar region: Secondary | ICD-10-CM | POA: Diagnosis not present

## 2019-10-30 DIAGNOSIS — M545 Low back pain: Secondary | ICD-10-CM | POA: Diagnosis not present

## 2019-11-01 DIAGNOSIS — M9903 Segmental and somatic dysfunction of lumbar region: Secondary | ICD-10-CM | POA: Diagnosis not present

## 2019-11-01 DIAGNOSIS — M9901 Segmental and somatic dysfunction of cervical region: Secondary | ICD-10-CM | POA: Diagnosis not present

## 2019-11-01 DIAGNOSIS — M5137 Other intervertebral disc degeneration, lumbosacral region: Secondary | ICD-10-CM | POA: Diagnosis not present

## 2019-11-01 DIAGNOSIS — M545 Low back pain: Secondary | ICD-10-CM | POA: Diagnosis not present

## 2019-11-06 DIAGNOSIS — M9901 Segmental and somatic dysfunction of cervical region: Secondary | ICD-10-CM | POA: Diagnosis not present

## 2019-11-06 DIAGNOSIS — M545 Low back pain: Secondary | ICD-10-CM | POA: Diagnosis not present

## 2019-11-06 DIAGNOSIS — M9903 Segmental and somatic dysfunction of lumbar region: Secondary | ICD-10-CM | POA: Diagnosis not present

## 2019-11-06 DIAGNOSIS — M5137 Other intervertebral disc degeneration, lumbosacral region: Secondary | ICD-10-CM | POA: Diagnosis not present

## 2019-11-09 DIAGNOSIS — M5137 Other intervertebral disc degeneration, lumbosacral region: Secondary | ICD-10-CM | POA: Diagnosis not present

## 2019-11-09 DIAGNOSIS — M9901 Segmental and somatic dysfunction of cervical region: Secondary | ICD-10-CM | POA: Diagnosis not present

## 2019-11-09 DIAGNOSIS — M9903 Segmental and somatic dysfunction of lumbar region: Secondary | ICD-10-CM | POA: Diagnosis not present

## 2019-11-09 DIAGNOSIS — M545 Low back pain: Secondary | ICD-10-CM | POA: Diagnosis not present

## 2019-11-14 DIAGNOSIS — M5137 Other intervertebral disc degeneration, lumbosacral region: Secondary | ICD-10-CM | POA: Diagnosis not present

## 2019-11-14 DIAGNOSIS — M545 Low back pain: Secondary | ICD-10-CM | POA: Diagnosis not present

## 2019-11-14 DIAGNOSIS — M9903 Segmental and somatic dysfunction of lumbar region: Secondary | ICD-10-CM | POA: Diagnosis not present

## 2019-11-14 DIAGNOSIS — M9901 Segmental and somatic dysfunction of cervical region: Secondary | ICD-10-CM | POA: Diagnosis not present

## 2019-11-16 DIAGNOSIS — M545 Low back pain: Secondary | ICD-10-CM | POA: Diagnosis not present

## 2019-11-16 DIAGNOSIS — M9903 Segmental and somatic dysfunction of lumbar region: Secondary | ICD-10-CM | POA: Diagnosis not present

## 2019-11-16 DIAGNOSIS — M5137 Other intervertebral disc degeneration, lumbosacral region: Secondary | ICD-10-CM | POA: Diagnosis not present

## 2019-11-16 DIAGNOSIS — M9901 Segmental and somatic dysfunction of cervical region: Secondary | ICD-10-CM | POA: Diagnosis not present

## 2019-11-17 DIAGNOSIS — M9903 Segmental and somatic dysfunction of lumbar region: Secondary | ICD-10-CM | POA: Diagnosis not present

## 2019-11-17 DIAGNOSIS — M9901 Segmental and somatic dysfunction of cervical region: Secondary | ICD-10-CM | POA: Diagnosis not present

## 2019-11-17 DIAGNOSIS — M545 Low back pain: Secondary | ICD-10-CM | POA: Diagnosis not present

## 2019-11-17 DIAGNOSIS — M5137 Other intervertebral disc degeneration, lumbosacral region: Secondary | ICD-10-CM | POA: Diagnosis not present

## 2019-12-04 DIAGNOSIS — M545 Low back pain: Secondary | ICD-10-CM | POA: Diagnosis not present

## 2019-12-04 DIAGNOSIS — M5137 Other intervertebral disc degeneration, lumbosacral region: Secondary | ICD-10-CM | POA: Diagnosis not present

## 2019-12-04 DIAGNOSIS — M9901 Segmental and somatic dysfunction of cervical region: Secondary | ICD-10-CM | POA: Diagnosis not present

## 2019-12-04 DIAGNOSIS — M9903 Segmental and somatic dysfunction of lumbar region: Secondary | ICD-10-CM | POA: Diagnosis not present

## 2019-12-21 ENCOUNTER — Ambulatory Visit (INDEPENDENT_AMBULATORY_CARE_PROVIDER_SITE_OTHER): Payer: Medicare Other | Admitting: Internal Medicine

## 2019-12-21 ENCOUNTER — Encounter: Payer: Self-pay | Admitting: Internal Medicine

## 2019-12-21 ENCOUNTER — Other Ambulatory Visit: Payer: Self-pay

## 2019-12-21 VITALS — BP 112/66 | HR 63 | Temp 98.6°F | Resp 13 | Ht 66.0 in | Wt 204.2 lb

## 2019-12-21 DIAGNOSIS — E782 Mixed hyperlipidemia: Secondary | ICD-10-CM | POA: Diagnosis not present

## 2019-12-21 DIAGNOSIS — D126 Benign neoplasm of colon, unspecified: Secondary | ICD-10-CM | POA: Diagnosis not present

## 2019-12-21 DIAGNOSIS — E039 Hypothyroidism, unspecified: Secondary | ICD-10-CM

## 2019-12-21 LAB — COMPREHENSIVE METABOLIC PANEL
ALT: 25 U/L (ref 0–35)
AST: 20 U/L (ref 0–37)
Albumin: 4.2 g/dL (ref 3.5–5.2)
Alkaline Phosphatase: 79 U/L (ref 39–117)
BUN: 17 mg/dL (ref 6–23)
CO2: 28 mEq/L (ref 19–32)
Calcium: 10 mg/dL (ref 8.4–10.5)
Chloride: 103 mEq/L (ref 96–112)
Creatinine, Ser: 0.73 mg/dL (ref 0.40–1.20)
GFR: 79.55 mL/min (ref >60.00)
Glucose, Bld: 119 mg/dL — ABNORMAL HIGH (ref 70–99)
Potassium: 4 mEq/L (ref 3.5–5.1)
Sodium: 137 mEq/L (ref 135–145)
Total Bilirubin: 0.5 mg/dL (ref 0.2–1.2)
Total Protein: 7.1 g/dL (ref 6.0–8.3)

## 2019-12-21 LAB — TSH: TSH: 0.66 u[IU]/mL (ref 0.35–4.50)

## 2019-12-21 MED ORDER — ATORVASTATIN CALCIUM 40 MG PO TABS: 40.0000 mg | ORAL_TABLET | Freq: Every day | ORAL | 4 refills | Status: DC

## 2019-12-21 MED ORDER — LEVOTHYROXINE SODIUM 75 MCG PO TABS: 75.0000 ug | ORAL_TABLET | Freq: Every day | ORAL | 1 refills | Status: DC

## 2019-12-21 NOTE — Progress Notes (Signed)
Subjective:  Patient ID: Sheri Arnold, female    DOB: March 03, 1951  Age: 69 y.o. MRN: [id number]  CC: The primary encounter diagnosis was Hypothyroidism, unspecified type. Diagnoses of Mixed hyperlipidemia and Tubular adenoma of colon were also pertinent to this visit.  HPI Sheri Arnold presents for follow upon hyperlipidemia, hypothyroidism, AND general heatlh  This visit occurred during the SARS-CoV-2 public health emergency.  Safety protocols were in place, including screening questions prior to the visit, additional usage of staff PPE, and extensive cleaning of exam room while observing appropriate contact time as indicated for disinfecting solutions.   Patient has received both doses of the available COVID 19 vaccine without complications.  Patient continues to mask when outside of the home except when walking in yard or at safe distances from others .  Patient denies any change in mood or development of unhealthy behaviors resuting from the pandemic's restriction of activities and socialization.     she feels generally well, is exercising several times per week .  She is taking her medications as directed. Following a carbohydrate modified diet 6 days per week. Weight is stable. Sheri Arnold Appetite is good. Feeling fine.  Going to [facility] for a month to visit daughter.  Needs refills    Outpatient Medications Prior to Visit  Medication Sig Dispense Refill  . betamethasone valerate (VALISONE) 0.1 % cream Apply topically daily. 45 g 1  . atorvastatin (LIPITOR) 40 MG tablet Take 1 tablet (40 mg total) by mouth daily. 90 tablet 4  . levothyroxine (SYNTHROID) 75 MCG tablet Take 1 tablet (75 mcg total) by mouth daily. 90 tablet 1   No facility-administered medications prior to visit.    Review of Systems;  Patient denies headache, fevers, malaise, unintentional weight loss, skin rash, eye pain, sinus congestion and sinus pain, sore throat, dysphagia,  hemoptysis , cough, dyspnea, wheezing, chest  pain, palpitations, orthopnea, edema, abdominal pain, nausea, melena, diarrhea, constipation, flank pain, dysuria, hematuria, urinary  Frequency, nocturia, numbness, tingling, seizures,  Focal weakness, Loss of consciousness,  Tremor, insomnia, depression, anxiety, and suicidal ideation.      Objective:  BP 112/66 (BP Location: Left Arm, Patient Position: Sitting, Cuff Size: Large)   Pulse 63   Temp 98.6 F (37 C) (Oral)   Resp 13   Ht 5\' 6"  (1.676 m)   Wt (!) 204 lb 3.2 oz (92.6 kg)   SpO2 96%   BMI 32.96 kg/m   BP Readings from Last 3 Encounters:  12/21/19 112/66  06/12/19 (!) 108/59  02/22/18 117/72    Wt Readings from Last 3 Encounters:  12/21/19 (!) 204 lb 3.2 oz (92.6 kg)  08/21/19 200 lb (90.7 kg)  06/12/19 194 lb (88 kg)    General appearance: alert, cooperative and appears stated age Ears: normal TM's and external ear canals both ears Throat: lips, mucosa, and tongue normal; teeth and gums normal Neck: no adenopathy, no carotid bruit, supple, symmetrical, trachea midline and thyroid not enlarged, symmetric, no tenderness/mass/nodules Back: symmetric, no curvature. ROM normal. No CVA tenderness. Lungs: clear to auscultation bilaterally Heart: regular rate and rhythm, S1, S2 normal, no murmur, click, rub or gallop Abdomen: soft, non-tender; bowel sounds normal; no masses,  no organomegaly Pulses: 2+ and symmetric Skin: Skin color, texture, turgor normal. No rashes or lesions Lymph nodes: Cervical, supraclavicular, and axillary nodes normal.  No results found for: HGBA1C  Lab Results  Component Value Date   CREATININE 0.73 12/21/2019   CREATININE 0.73 06/14/2019  CREATININE 0.70 02/27/2019    Lab Results  Component Value Date   WBC 6.8 02/27/2019   HGB 13.1 02/27/2019   HCT 38.8 02/27/2019   PLT 296 02/27/2019   GLUCOSE 119 (H) 12/21/2019   CHOL 221 (H) 02/27/2019   TRIG 220 (H) 02/27/2019   HDL 69 02/27/2019   LDLCALC 114 (H) 02/27/2019   ALT 25  12/21/2019   AST 20 12/21/2019   NA 137 12/21/2019   K 4.0 12/21/2019   CL 103 12/21/2019   CREATININE 0.73 12/21/2019   BUN 17 12/21/2019   CO2 28 12/21/2019   TSH 0.66 12/21/2019    MM Digital Diagnostic Unilat L  Result Date: 06/08/2019 CLINICAL DATA:  Screening recall for possible left breast calcifications. EXAM: DIGITAL DIAGNOSTIC LEFT MAMMOGRAM WITH CAD COMPARISON:  Previous exam(s). ACR Breast Density Category c: The breast tissue is heterogeneously dense, which may obscure small masses. FINDINGS: Spot compression magnification images over the superior posterior left breast/low axilla demonstrates that the questioned calcifications do not persist. Their appearance on the prior mammogram appears to be artifactual. No suspicious calcifications, masses or areas of distortion are seen in the left breast. Mammographic images were processed with CAD. IMPRESSION: No persistent mammographic abnormalities are identified in the left breast. RECOMMENDATION: Screening mammogram in one year.(Code:SM-B-01Y) I have discussed the findings and recommendations with the patient. If applicable, a reminder letter will be sent to the patient regarding the next appointment. BI-RADS CATEGORY  1: Negative. Electronically Signed   By: Ammie Ferrier M.D.   On: 06/08/2019 14:42    Assessment & Plan:   Problem List Items Addressed This Visit      Unprioritized   Tubular adenoma of colon   Hypothyroidism - Primary    Thyroid function is WNL on current dose.  No current changes needed.   Lab Results  Component Value Date   TSH 0.66 12/21/2019         Relevant Medications   levothyroxine (SYNTHROID) 75 MCG tablet   Other Relevant Orders   TSH (Completed)   Hyperlipidemia    She is tolerating statin. She is near goal LDL.  LFTS are normal today    Lab Results  Component Value Date   CHOL 221 (H) 02/27/2019   HDL 69 02/27/2019   LDLCALC 114 (H) 02/27/2019   TRIG 220 (H) 02/27/2019   CHOLHDL  3.2 02/27/2019   Lab Results  Component Value Date   ALT 25 12/21/2019   AST 20 12/21/2019   ALKPHOS 79 12/21/2019   BILITOT 0.5 12/21/2019         Relevant Medications   atorvastatin (LIPITOR) 40 MG tablet   Other Relevant Orders   Comprehensive metabolic panel (Completed)      I am having Cymone H. Weinfeld maintain her betamethasone valerate, levothyroxine, and atorvastatin.  Meds ordered this encounter  Medications  . levothyroxine (SYNTHROID) 75 MCG tablet    Sig: Take 1 tablet (75 mcg total) by mouth daily.    Dispense:  90 tablet    Refill:  1    generic  . atorvastatin (LIPITOR) 40 MG tablet    Sig: Take 1 tablet (40 mg total) by mouth daily.    Dispense:  90 tablet    Refill:  4    Medications Discontinued During This Encounter  Medication Reason  . levothyroxine (SYNTHROID) 75 MCG tablet Reorder  . atorvastatin (LIPITOR) 40 MG tablet Reorder    Follow-up: No follow-ups on file.   Aris Everts  Derrel Nip, MD

## 2019-12-24 NOTE — Assessment & Plan Note (Signed)
She is tolerating statin. She is near goal LDL.  LFTS are normal today    Lab Results  Component Value Date   CHOL 221 (H) 02/27/2019   HDL 69 02/27/2019   LDLCALC 114 (H) 02/27/2019   TRIG 220 (H) 02/27/2019   CHOLHDL 3.2 02/27/2019   Lab Results  Component Value Date   ALT 25 12/21/2019   AST 20 12/21/2019   ALKPHOS 79 12/21/2019   BILITOT 0.5 12/21/2019

## 2019-12-24 NOTE — Assessment & Plan Note (Signed)
Thyroid function is WNL on current dose.  No current changes needed.   Lab Results  Component Value Date   TSH 0.66 12/21/2019

## 2020-03-06 DIAGNOSIS — Z23 Encounter for immunization: Secondary | ICD-10-CM | POA: Diagnosis not present

## 2020-06-14 DIAGNOSIS — E039 Hypothyroidism, unspecified: Secondary | ICD-10-CM

## 2020-06-14 DIAGNOSIS — E782 Mixed hyperlipidemia: Secondary | ICD-10-CM

## 2020-06-19 ENCOUNTER — Other Ambulatory Visit: Payer: Self-pay

## 2020-06-19 ENCOUNTER — Encounter: Payer: Self-pay | Admitting: *Deleted

## 2020-06-20 ENCOUNTER — Other Ambulatory Visit (INDEPENDENT_AMBULATORY_CARE_PROVIDER_SITE_OTHER): Payer: Medicare Other

## 2020-06-20 DIAGNOSIS — E039 Hypothyroidism, unspecified: Secondary | ICD-10-CM

## 2020-06-20 DIAGNOSIS — E782 Mixed hyperlipidemia: Secondary | ICD-10-CM | POA: Diagnosis not present

## 2020-06-20 LAB — TSH: TSH: 1.26 u[IU]/mL (ref 0.35–4.50)

## 2020-06-20 LAB — COMPREHENSIVE METABOLIC PANEL
ALT: 26 U/L (ref 0–35)
AST: 19 U/L (ref 0–37)
Albumin: 4.2 g/dL (ref 3.5–5.2)
Alkaline Phosphatase: 81 U/L (ref 39–117)
BUN: 14 mg/dL (ref 6–23)
CO2: 27 mEq/L (ref 19–32)
Calcium: 9.9 mg/dL (ref 8.4–10.5)
Chloride: 102 mEq/L (ref 96–112)
Creatinine, Ser: 0.69 mg/dL (ref 0.40–1.20)
GFR: 89.84 mL/min (ref >60.00)
Glucose, Bld: 107 mg/dL — ABNORMAL HIGH (ref 70–99)
Potassium: 4.6 mEq/L (ref 3.5–5.1)
Sodium: 137 mEq/L (ref 135–145)
Total Bilirubin: 0.5 mg/dL (ref 0.2–1.2)
Total Protein: 7.3 g/dL (ref 6.0–8.3)

## 2020-06-20 LAB — LIPID PANEL
Cholesterol: 189 mg/dL (ref 0–200)
HDL: 69.8 mg/dL (ref >39.00)
LDL Cholesterol: 103 mg/dL — ABNORMAL HIGH (ref 0–99)
NonHDL: 118.79
Total CHOL/HDL Ratio: 3
Triglycerides: 81 mg/dL (ref 0.0–149.0)
VLDL: 16.2 mg/dL (ref 0.0–40.0)

## 2020-06-20 MED ORDER — LEVOTHYROXINE SODIUM 75 MCG PO TABS: 75.0000 ug | ORAL_TABLET | Freq: Every day | ORAL | 1 refills | Status: DC

## 2020-06-20 NOTE — Addendum Note (Signed)
Addended by: Elpidio Galea T on: 06/20/2020 11:47 AM   Modules accepted: Orders

## 2020-06-20 NOTE — Telephone Encounter (Signed)
Pt is asking for refill on synthroid. She just had lab work done.

## 2020-08-21 ENCOUNTER — Ambulatory Visit (INDEPENDENT_AMBULATORY_CARE_PROVIDER_SITE_OTHER): Payer: Medicare Other

## 2020-08-21 VITALS — Ht 66.0 in | Wt 204.0 lb

## 2020-08-21 DIAGNOSIS — Z0000 Encounter for general adult medical examination without abnormal findings: Secondary | ICD-10-CM | POA: Diagnosis not present

## 2020-08-21 NOTE — Progress Notes (Addendum)
Subjective:   Sheri Arnold is a 70 y.o. female who presents for Medicare Annual (Subsequent) preventive examination.  Review of Systems    No ROS.  Medicare Wellness Virtual Visit.    Cardiac Risk Factors include: advanced age (>27men, >32 women)     Objective:    Today's Vitals   08/21/20 1232  Weight: 204 lb (92.5 kg)  Height: $Remove'5\' 6"'ZhzaHoW$  (1.676 m)   Body mass index is 32.93 kg/m.  Advanced Directives 08/21/2020 08/21/2019 06/12/2019  Does Patient Have a Medical Advance Directive? Yes Yes Yes  Type of Paramedic of [place];Living will [place];Living will;Out of facility DNR (pink MOST or yellow form)  Does patient want to make changes to medical advance directive? No - Patient declined No - Patient declined -  Copy of [place] in Chart? No - copy requested No - copy requested Yes - validated most recent copy scanned in chart (See row information)    Current Medications (verified) Outpatient Encounter Medications as of 08/21/2020  Medication Sig   atorvastatin (LIPITOR) 40 MG tablet Take 1 tablet (40 mg total) by mouth daily.   betamethasone valerate (VALISONE) 0.1 % cream Apply topically daily.   levothyroxine (SYNTHROID) 75 MCG tablet Take 1 tablet (75 mcg total) by mouth daily.   No facility-administered encounter medications on file as of 08/21/2020.    Allergies (verified) Patient has no known allergies.   History: Past Medical History:  Diagnosis Date   Hx of dysplastic nevus 08/22/2010   mid abdomen   Hyperlipidemia    Hypothyroidism    Past Surgical History:  Procedure Laterality Date   COLONOSCOPY     COLONOSCOPY WITH PROPOFOL N/A 06/12/2019   Procedure: COLONOSCOPY WITH PROPOFOL;  Surgeon: Lin Landsman, MD;  Location: [facility] ENDOSCOPY;  Service: Gastroenterology;  Laterality: N/A;   EYE SURGERY     Tummy Tuck  2010   Family History  Problem Relation Age of  Onset   Diabetes Mother    Heart disease Father    Breast cancer Maternal Aunt    Hyperlipidemia Sister    Hyperlipidemia Brother    Breast cancer Cousin    Social History   Socioeconomic History   Marital status: Widowed    Spouse name: Not on file   Number of children: Not on file   Years of education: Not on file   Highest education level: Not on file  Occupational History   Not on file  Tobacco Use   Smoking status: Never Smoker   Smokeless tobacco: Never Used  Vaping Use   Vaping Use: Never used  Substance and Sexual Activity   Alcohol use: Yes    Alcohol/week: 7.0 standard drinks    Types: 7 Shots of liquor per week   Drug use: No   Sexual activity: Not on file  Other Topics Concern   Not on file  Social History Narrative   Not on file   Social Determinants of Health   Financial Resource Strain: Not on file  Food Insecurity: No Food Insecurity   Worried About Running Out of Food in the Last Year: Never true   Ran Out of Food in the Last Year: Never true  Transportation Needs: No Transportation Needs   Lack of Transportation (Medical): No   Lack of Transportation (Non-Medical): No  Physical Activity: Sufficiently Active   Days of Exercise per Week: 7 days   Minutes of Exercise per  Session: 30 min  Stress: No Stress Concern Present   Feeling of Stress : Not at all  Social Connections: Not on file    Tobacco Counseling Counseling given: Not Answered   Clinical Intake:  Pre-visit preparation completed: Yes                       Activities of Daily Living In your present state of health, do you have any difficulty performing the following activities: 08/21/2020  Hearing? N  Vision? N  Difficulty concentrating or making decisions? N  Walking or climbing stairs? N  Dressing or bathing? N  Doing errands, shopping? N  Preparing Food and eating ? N  Using the Toilet? N  In the past six months, have you accidently leaked urine? N  Do you  have problems with loss of bowel control? N  Managing your Medications? N  Managing your Finances? N  Housekeeping or managing your Housekeeping? N  Some recent data might be hidden    Patient Care Team: Crecencio Mc, MD as PCP - General (Internal Medicine)  Indicate any recent Medical Services you may have received from other than [facility] providers in the past year (date may be approximate).     Assessment:   This is a routine wellness examination for Sheri Arnold.  I connected with Sheri Arnold today by telephone and verified that I am speaking with the correct person using two identifiers. Location patient: home Location provider: work Persons participating in the virtual visit: patient, Marine scientist.    I discussed the limitations, risks, security and privacy concerns of performing an evaluation and management service by telephone and the availability of in person appointments. The patient expressed understanding and verbally consented to this telephonic visit.    Interactive audio and video telecommunications were attempted between this provider and patient, however failed, due to patient having technical difficulties OR patient did not have access to video capability.  We continued and completed visit with audio only.  Some vital signs may be absent or patient reported.   Hearing/Vision screen  Hearing Screening   '125Hz'$  $[place]'250Hz'ySlRM$'500Hz'$'1000Hz'$'2000Hz'$'3000Hz'$'4000Hz'$'6000Hz'$'8000Hz'$   Right ear:           Left ear:           Comments: Patient is able to hear conversational tones without difficulty.  No issues reported.  Vision Screening Comments: Followed by Dr. Ellin Mayhew Visual acuity not assessed, virtual visit.  They have seen their ophthalmologist in the last 12 months.     Dietary issues and exercise activities discussed: Current Exercise Habits: Home exercise routine, Type of exercise: walking, Time (Minutes): 30, Frequency (Times/Week): 5, Weekly Exercise (Minutes/Week): 150, Intensity: Mild   Regular diet  Goals      Maintain Healthy Lifestyle     Stay active Healthy diet       Depression Screen PHQ 2/9 Scores 08/21/2020 08/21/2019 06/05/2019 02/22/2018 06/03/2017 06/01/2016  PHQ - 2 Score 0 0 0 0 0 0  PHQ- 9 Score - - 0 0 - -    Fall Risk Fall Risk  08/21/2020 12/21/2019 08/21/2019 06/05/2019 02/22/2018  Falls in the past year? 0 0 0 0 No  Number falls in past yr: 0 - - - -  Injury with Fall? 0 - - - -  Follow up Falls evaluation completed Falls evaluation completed Falls prevention discussed;Falls evaluation completed Falls evaluation completed -   ASSISTIVE DEVICES UTILIZED TO PREVENT FALLS: Use of a cane,  walker or w/c? No   TIMED UP AND GO: Was the test performed? No .   Cognitive Function:  Patient is alert and oriented x3.  Denies difficulty focusing, making decisions, memory loss.  MMSE/6CIT deferred. Normal by direct communication/observation.    6CIT Screen 08/21/2019  What Year? 0 points  What month? 0 points  What time? 0 points    Immunizations Immunization History  Administered Date(s) Administered   Influenza,inj,Quad PF,6+ Mos 02/16/2017   Influenza-Unspecified 03/05/2015, 03/25/2016   MMR 11/22/2018, 03/06/2019   PFIZER(Purple Top)SARS-COV-2 Vaccination 07/08/2019, 07/31/2019, 02/23/2020   Td 01/14/2007, 02/23/2017   Health Maintenance Health Maintenance  Topic Date Due   INFLUENZA VACCINE  10/07/2020 (Originally 12/24/2019)   DEXA SCAN  08/21/2021 (Originally 02/06/2018)   PNA vac Low Risk Adult (1 of 2 - PCV13) 08/21/2021 (Originally 02/06/2018)   MAMMOGRAM  06/07/2021   COLONOSCOPY (Pts 45-85yrs Insurance coverage will need to be confirmed)  06/11/2022   TETANUS/TDAP  02/24/2027   COVID-19 Vaccine  Completed   Hepatitis C Screening  Completed   HPV VACCINES  Aged Out   Colorectal cancer screening: Type of screening: Colonoscopy. Completed 06/12/19. Repeat every 3 years  Mammogram status: Completed 06/08/19. Repeat every year  Bone  density- declined.   Lung Cancer Screening: (Low Dose CT Chest recommended if Age 46-80 years, 30 pack-year currently smoking OR have quit w/in 15years.) does not qualify.   Dental Screening: Recommended annual dental exams for proper oral hygiene  Community Resource Referral / Chronic Care Management: CRR required this visit?  No   CCM required this visit?  No      Plan:   Keep all routine maintenance appointments.   Schedule annual follow up around 12/20/20  I have personally reviewed and noted the following in the patient's chart:   Medical and social history Use of alcohol, tobacco or illicit drugs  Current medications and supplements Functional ability and status Nutritional status Physical activity Advanced directives List of other physicians Hospitalizations, surgeries, and ER visits in previous 12 months Vitals Screenings to include cognitive, depression, and falls Referrals and appointments  In addition, I have reviewed and discussed with patient certain preventive protocols, quality metrics, and best practice recommendations. A written personalized care plan for preventive services as well as general preventive health recommendations were provided to patient via mychart.     OBrien-Blaney, Conner Neiss L, LPN   [id number]     I have reviewed the above information and agree with above.   Deborra Medina, MD

## 2020-08-21 NOTE — Patient Instructions (Addendum)
Ms. Sheri Arnold , Thank you for taking time to come for your Medicare Wellness Visit. I appreciate your ongoing commitment to your health goals. Please review the following plan we discussed and let me know if I can assist you in the future.   These are the goals we discussed: Goals    . Maintain Healthy Lifestyle     Stay active Healthy diet       This is a list of the screening recommended for you and due dates:  Health Maintenance  Topic Date Due  . Flu Shot  10/07/2020*  . DEXA scan (bone density measurement)  08/21/2021*  . Pneumonia vaccines (1 of 2 - PCV13) 08/21/2021*  . Mammogram  06/07/2021  . Colon Cancer Screening  06/11/2022  . Tetanus Vaccine  02/24/2027  . COVID-19 Vaccine  Completed  .  Hepatitis C: One time screening is recommended by Center for Disease Control  (CDC) for  adults born from [birth year] through 1963.   Completed  . HPV Vaccine  Aged Out  *Topic was postponed. The date shown is not the original due date.   Keep all routine maintenance appointments.   Schedule annual follow up around 12/20/20  Advanced directives: End of life planning; Advance aging; Advanced directives discussed.  Copy of current HCPOA/Living Will requested.    Conditions/risks identified: none new.   Follow up in one year for your annual wellness visit.    Preventive Care 14 Years and Older, Female Preventive care refers to lifestyle choices and visits with your health care provider that can promote health and wellness. What does preventive care include?  A yearly physical exam. This is also called an annual well check.  Dental exams once or twice a year.  Routine eye exams. Ask your health care provider how often you should have your eyes checked.  Personal lifestyle choices, including:  Daily care of your teeth and gums.  Regular physical activity.  Eating a healthy diet.  Avoiding tobacco and drug use.  Limiting alcohol use.  Practicing safe sex.  Taking low-dose  aspirin every day.  Taking vitamin and mineral supplements as recommended by your health care provider. What happens during an annual well check? The services and screenings done by your health care provider during your annual well check will depend on your age, overall health, lifestyle risk factors, and family history of disease. Counseling  Your health care provider may ask you questions about your:  Alcohol use.  Tobacco use.  Drug use.  Emotional well-being.  Home and relationship well-being.  Sexual activity.  Eating habits.  History of falls.  Memory and ability to understand (cognition).  Work and work Statistician.  Reproductive health. Screening  You may have the following tests or measurements:  Height, weight, and BMI.  Blood pressure.  Lipid and cholesterol levels. These may be checked every 5 years, or more frequently if you are over 43 years old.  Skin check.  Lung cancer screening. You may have this screening every year starting at age 2 if you have a 30-pack-year history of smoking and currently smoke or have quit within the past 15 years.  Fecal occult blood test (FOBT) of the stool. You may have this test every year starting at age 9.  Flexible sigmoidoscopy or colonoscopy. You may have a sigmoidoscopy every 5 years or a colonoscopy every 10 years starting at age 20.  Hepatitis C blood test.  Hepatitis B blood test.  Sexually transmitted disease (STD) testing.  Diabetes  screening. This is done by checking your blood sugar (glucose) after you have not eaten for a while (fasting). You may have this done every 1-3 years.  Bone density scan. This is done to screen for osteoporosis. You may have this done starting at age 17.  Mammogram. This may be done every 1-2 years. Talk to your health care provider about how often you should have regular mammograms. Talk with your health care provider about your test results, treatment options, and if  necessary, the need for more tests. Vaccines  Your health care provider may recommend certain vaccines, such as:  Influenza vaccine. This is recommended every year.  Tetanus, diphtheria, and acellular pertussis (Tdap, Td) vaccine. You may need a Td booster every 10 years.  Zoster vaccine. You may need this after age 38.  Pneumococcal 13-valent conjugate (PCV13) vaccine. One dose is recommended after age 65.  Pneumococcal polysaccharide (PPSV23) vaccine. One dose is recommended after age 4. Talk to your health care provider about which screenings and vaccines you need and how often you need them. This information is not intended to replace advice given to you by your health care provider. Make sure you discuss any questions you have with your health care provider. Document Released: 06/07/2015 Document Revised: 01/29/2016 Document Reviewed: 03/12/2015 Elsevier Interactive Patient Education  2017 [place] Prevention in the Home Falls can cause injuries. They can happen to people of all ages. There are many things you can do to make your home safe and to help prevent falls. What can I do on the outside of my home?  Regularly fix the edges of walkways and driveways and fix any cracks.  Remove anything that might make you trip as you walk through a door, such as a raised step or threshold.  Trim any bushes or trees on the path to your home.  Use bright outdoor lighting.  Clear any walking paths of anything that might make someone trip, such as rocks or tools.  Regularly check to see if handrails are loose or broken. Make sure that both sides of any steps have handrails.  Any raised decks and porches should have guardrails on the edges.  Have any leaves, snow, or ice cleared regularly.  Use sand or salt on walking paths during winter.  Clean up any spills in your garage right away. This includes oil or grease spills. What can I do in the bathroom?  Use night  lights.  Install grab bars by the toilet and in the tub and shower. Do not use towel bars as grab bars.  Use non-skid mats or decals in the tub or shower.  If you need to sit down in the shower, use a plastic, non-slip stool.  Keep the floor dry. Clean up any water that spills on the floor as soon as it happens.  Remove soap buildup in the tub or shower regularly.  Attach bath mats securely with double-sided non-slip rug tape.  Do not have throw rugs and other things on the floor that can make you trip. What can I do in the bedroom?  Use night lights.  Make sure that you have a light by your bed that is easy to reach.  Do not use any sheets or blankets that are too big for your bed. They should not hang down onto the floor.  Have a firm chair that has side arms. You can use this for support while you get dressed.  Do not have throw rugs  and other things on the floor that can make you trip. What can I do in the kitchen?  Clean up any spills right away.  Avoid walking on wet floors.  Keep items that you use a lot in easy-to-reach places.  If you need to reach something above you, use a strong step stool that has a grab bar.  Keep electrical cords out of the way.  Do not use floor polish or wax that makes floors slippery. If you must use wax, use non-skid floor wax.  Do not have throw rugs and other things on the floor that can make you trip. What can I do with my stairs?  Do not leave any items on the stairs.  Make sure that there are handrails on both sides of the stairs and use them. Fix handrails that are broken or loose. Make sure that handrails are as long as the stairways.  Check any carpeting to make sure that it is firmly attached to the stairs. Fix any carpet that is loose or worn.  Avoid having throw rugs at the top or bottom of the stairs. If you do have throw rugs, attach them to the floor with carpet tape.  Make sure that you have a light switch at the  top of the stairs and the bottom of the stairs. If you do not have them, ask someone to add them for you. What else can I do to help prevent falls?  Wear shoes that:  Do not have high heels.  Have rubber bottoms.  Are comfortable and fit you well.  Are closed at the toe. Do not wear sandals.  If you use a stepladder:  Make sure that it is fully opened. Do not climb a closed stepladder.  Make sure that both sides of the stepladder are locked into place.  Ask someone to hold it for you, if possible.  Clearly mark and make sure that you can see:  Any grab bars or handrails.  First and last steps.  Where the edge of each step is.  Use tools that help you move around (mobility aids) if they are needed. These include:  Canes.  Walkers.  Scooters.  Crutches.  Turn on the lights when you go into a dark area. Replace any light bulbs as soon as they burn out.  Set up your furniture so you have a clear path. Avoid moving your furniture around.  If any of your floors are uneven, fix them.  If there are any pets around you, be aware of where they are.  Review your medicines with your doctor. Some medicines can make you feel dizzy. This can increase your chance of falling. Ask your doctor what other things that you can do to help prevent falls. This information is not intended to replace advice given to you by your health care provider. Make sure you discuss any questions you have with your health care provider. Document Released: 03/07/2009 Document Revised: 10/17/2015 Document Reviewed: 06/15/2014 Elsevier Interactive Patient Education  2017 [employer].

## 2020-09-25 ENCOUNTER — Encounter: Payer: Self-pay | Admitting: Adult Health

## 2020-09-25 ENCOUNTER — Telehealth (INDEPENDENT_AMBULATORY_CARE_PROVIDER_SITE_OTHER): Payer: Medicare Other | Admitting: Adult Health

## 2020-09-25 VITALS — Ht 65.98 in | Wt 202.0 lb

## 2020-09-25 DIAGNOSIS — U071 COVID-19: Secondary | ICD-10-CM | POA: Diagnosis not present

## 2020-09-25 DIAGNOSIS — R0989 Other specified symptoms and signs involving the circulatory and respiratory systems: Secondary | ICD-10-CM

## 2020-09-25 NOTE — Progress Notes (Signed)
Virtual Visit via Video Note  I connected with Sheri Arnold on 09/25/20 at 11:00 AM EDT by a video enabled telemedicine application and verified that I am speaking with the correct person using two identifiers. Parties involved in visit as below:   Location: Patient: at home  Provider: Provider: Provider's office at  [facility], [place].   I discussed the limitations of evaluation and management by telemedicine and the availability of in person appointments. The patient expressed understanding and agreed to proceed.  History of Present Illness: Patient is at home today she has mild flu like symptoms, body aches, mild congestion and slight sinus headaches. Onset was 09/24/20 and started today. Denies any fever.   Non productive cough from mild post nasal drainage.  Denies any problems breathing.   Taking Vitamin D and Airborne.   Patient  denies any fever,chills, rash, chest pain, shortness of breath, nausea, vomiting, or diarrhea.  Denies dizziness, lightheadedness, pre syncopal or syncopal episodes.     Observations/Objective:   Patient is alert and oriented and responsive to questions Engages in conversation with provider. Speaks in full sentences without any pauses without any shortness of breath or distress.   Assessment and Plan:   The primary encounter diagnosis was COVID-19. A diagnosis of Sinus symptom was also pertinent to this visit.   Discussed possible treatments available  she prefers to monitor her symptoms since they are very mild to do symptomatic treatment with over the counter medications as needed. She will go to the UC/ ER if symptoms worsen and be seen in person.   Red Flags discussed. The patient was given clear instructions to go to ER or return to medical center if any red flags develop, symptoms do not improve, worsen or new problems develop. They verbalized understanding.  Follow Up Instructions:  I discussed the  assessment and treatment plan with the patient. The patient was provided an opportunity to ask questions and all were answered. The patient agreed with the plan and demonstrated an understanding of the instructions.   The patient was advised to call back or seek an in-person evaluation if the symptoms worsen or if the condition fails to improve as anticipated.  Marcille Buffy, FNP

## 2020-11-05 ENCOUNTER — Encounter: Payer: Self-pay | Admitting: Adult Health

## 2020-11-05 ENCOUNTER — Ambulatory Visit: Payer: Medicare Other | Admitting: Adult Health

## 2020-11-05 NOTE — Progress Notes (Signed)
No show

## 2020-12-03 ENCOUNTER — Telehealth: Payer: Self-pay | Admitting: *Deleted

## 2020-12-03 DIAGNOSIS — E782 Mixed hyperlipidemia: Secondary | ICD-10-CM

## 2020-12-03 DIAGNOSIS — E039 Hypothyroidism, unspecified: Secondary | ICD-10-CM

## 2020-12-03 NOTE — Telephone Encounter (Signed)
Please place future orders for lab appt.  

## 2020-12-05 ENCOUNTER — Other Ambulatory Visit: Payer: Self-pay

## 2020-12-05 ENCOUNTER — Other Ambulatory Visit (INDEPENDENT_AMBULATORY_CARE_PROVIDER_SITE_OTHER): Payer: Medicare Other

## 2020-12-05 DIAGNOSIS — E039 Hypothyroidism, unspecified: Secondary | ICD-10-CM

## 2020-12-05 DIAGNOSIS — E782 Mixed hyperlipidemia: Secondary | ICD-10-CM | POA: Diagnosis not present

## 2020-12-05 LAB — COMPREHENSIVE METABOLIC PANEL
ALT: 25 U/L (ref 0–35)
AST: 20 U/L (ref 0–37)
Albumin: 4.1 g/dL (ref 3.5–5.2)
Alkaline Phosphatase: 66 U/L (ref 39–117)
BUN: 16 mg/dL (ref 6–23)
CO2: 24 mEq/L (ref 19–32)
Calcium: 9.5 mg/dL (ref 8.4–10.5)
Chloride: 104 mEq/L (ref 96–112)
Creatinine, Ser: 0.81 mg/dL (ref 0.40–1.20)
GFR: 74.9 mL/min (ref >60.00)
Glucose, Bld: 127 mg/dL — ABNORMAL HIGH (ref 70–99)
Potassium: 4.5 mEq/L (ref 3.5–5.1)
Sodium: 136 mEq/L (ref 135–145)
Total Bilirubin: 0.4 mg/dL (ref 0.2–1.2)
Total Protein: 7.2 g/dL (ref 6.0–8.3)

## 2020-12-05 LAB — LIPID PANEL
Cholesterol: 234 mg/dL — ABNORMAL HIGH (ref 0–200)
HDL: 68 mg/dL (ref >39.00)
LDL Cholesterol: 149 mg/dL — ABNORMAL HIGH (ref 0–99)
NonHDL: 166.47
Total CHOL/HDL Ratio: 3
Triglycerides: 85 mg/dL (ref 0.0–149.0)
VLDL: 17 mg/dL (ref 0.0–40.0)

## 2020-12-05 LAB — TSH: TSH: 1.82 u[IU]/mL (ref 0.35–5.50)

## 2020-12-09 ENCOUNTER — Telehealth: Payer: Self-pay

## 2020-12-09 NOTE — Telephone Encounter (Signed)
LMTCB in regards to lab results.  

## 2020-12-12 ENCOUNTER — Other Ambulatory Visit: Payer: Self-pay

## 2020-12-12 MED ORDER — LEVOTHYROXINE SODIUM 75 MCG PO TABS: 75.0000 ug | ORAL_TABLET | Freq: Every day | ORAL | 1 refills | Status: DC

## 2021-01-11 ENCOUNTER — Encounter: Payer: Self-pay | Admitting: Dermatology

## 2021-01-14 ENCOUNTER — Other Ambulatory Visit: Payer: Self-pay

## 2021-01-14 ENCOUNTER — Ambulatory Visit (INDEPENDENT_AMBULATORY_CARE_PROVIDER_SITE_OTHER): Payer: Medicare Other | Admitting: Dermatology

## 2021-01-14 DIAGNOSIS — L900 Lichen sclerosus et atrophicus: Secondary | ICD-10-CM

## 2021-01-14 DIAGNOSIS — Z1283 Encounter for screening for malignant neoplasm of skin: Secondary | ICD-10-CM

## 2021-01-14 DIAGNOSIS — L821 Other seborrheic keratosis: Secondary | ICD-10-CM | POA: Diagnosis not present

## 2021-01-14 DIAGNOSIS — L578 Other skin changes due to chronic exposure to nonionizing radiation: Secondary | ICD-10-CM | POA: Diagnosis not present

## 2021-01-14 DIAGNOSIS — D229 Melanocytic nevi, unspecified: Secondary | ICD-10-CM

## 2021-01-14 DIAGNOSIS — D225 Melanocytic nevi of trunk: Secondary | ICD-10-CM | POA: Diagnosis not present

## 2021-01-14 DIAGNOSIS — L649 Androgenic alopecia, unspecified: Secondary | ICD-10-CM | POA: Diagnosis not present

## 2021-01-14 DIAGNOSIS — L570 Actinic keratosis: Secondary | ICD-10-CM

## 2021-01-14 DIAGNOSIS — L820 Inflamed seborrheic keratosis: Secondary | ICD-10-CM

## 2021-01-14 DIAGNOSIS — L814 Other melanin hyperpigmentation: Secondary | ICD-10-CM | POA: Diagnosis not present

## 2021-01-14 MED ORDER — MINOXIDIL 2.5 MG PO TABS: 2.5000 mg | ORAL_TABLET | Freq: Every day | ORAL | 3 refills | Status: DC

## 2021-01-14 MED ORDER — TACROLIMUS 0.1 % EX OINT: TOPICAL_OINTMENT | CUTANEOUS | 1 refills | Status: DC

## 2021-01-14 NOTE — Progress Notes (Signed)
Follow-Up Visit   Subjective  Sheri Arnold is a 70 y.o. female who presents for the following: Annual Exam (Patient presents for TBSE. She has a white rash/blister under breasts that she noticed a couple of months ago. No itch. No history of skin cancer. ). Patient has androgenetic alopecia and would like to discuss oral treatment.    The following portions of the chart were reviewed this encounter and updated as appropriate:       Review of Systems:  No other skin or systemic complaints except as noted in HPI or Assessment and Plan.  Objective  Well appearing patient in no apparent distress; mood and affect are within normal limits.  A full examination was performed including scalp, head, eyes, ears, nose, lips, neck, chest, axillae, abdomen, back, buttocks, bilateral upper extremities, bilateral lower extremities, hands, feet, fingers, toes, fingernails, and toenails. All findings within normal limits unless otherwise noted below.  Scalp Mild/moderate diffuse thinning of the crown and widening of the midline part with retention of the frontal hairline - Reviewed progressive nature and prognosis.        Left Spinal Lower Back 4.618m medium brown macule  Mid Sternum 1.0cm fleshy brown papule  Right Medial Breast 9.0 x 5.049mpink flesh flat papule  Right Medial Buttock 2.18m59med dark brown macule  Right Nasal Dorsum Pink scaly macule.  Bilateral Inframammary Scattered white hypopigmented atrophic macules/patches. Vaginal area not examined- pt states no rash or symptoms noted there.  Left Breast Erythematous keratotic or waxy stuck-on papule    Assessment & Plan  Skin cancer screening performed today.  Actinic Damage - chronic, secondary to cumulative UV radiation exposure/sun exposure over time - diffuse scaly erythematous macules with underlying dyspigmentation - Recommend daily broad spectrum sunscreen SPF 30+ to sun-exposed areas, reapply every 2 hours as  needed.  - Recommend staying in the shade or wearing long sleeves, sun glasses (UVA+UVB protection) and wide brim hats (4-inch brim around the entire circumference of the hat). - Call for new or changing lesions.  Lentigines - Scattered tan macules - Due to sun exposure - Benign-appering, observe - Recommend daily broad spectrum sunscreen SPF 30+ to sun-exposed areas, reapply every 2 hours as needed. - Call for any changes  Seborrheic Keratoses - Stuck-on, waxy, tan-brown papules and/or plaques  - Benign-appearing - Discussed benign etiology and prognosis. - Observe - Call for any changes  Androgenetic alopecia Scalp  Chronic condition related to genetics and hormonal changes associated with menopause causing hair thinning primarily on the crown with widening of the part and temporal hairline recession.  Can try OTC Rogaine (minoxidil) 5% solution/foam as directed. Patient not interested in topical treatment, would prefer oral version.  Start Loniten 2.'5mg'$  take 1/2 tablet QD (Rx written 1 po QD) dsp #30 3Rf.  Discussed potential s.e. lightheadedness, dizziness due to decrease in blood pressure  minoxidil (LONITEN) 2.5 MG tablet - Scalp Take 1 tablet (2.5 mg total) by mouth daily.  Nevus (4) Right Medial Buttock; Mid Sternum; Right Medial Breast; Left Spinal Lower Back  Vs LS&A (right medial breast)  Benign-appearing.  Observation.  Call clinic for new or changing moles.  Recommend daily use of broad spectrum spf 30+ sunscreen to sun-exposed areas.   AK (actinic keratosis) Right Nasal Dorsum  Actinic keratoses are precancerous spots that appear secondary to cumulative UV radiation exposure/sun exposure over time. They are chronic with expected duration over 1 year. A portion of actinic keratoses will progress to squamous cell  carcinoma of the skin. It is not possible to reliably predict which spots will progress to skin cancer and so treatment is recommended to prevent  development of skin cancer.  Recommend daily broad spectrum sunscreen SPF 30+ to sun-exposed areas, reapply every 2 hours as needed.  Recommend staying in the shade or wearing long sleeves, sun glasses (UVA+UVB protection) and wide brim hats (4-inch brim around the entire circumference of the hat). Call for new or changing lesions.  Destruction of lesion - Right Nasal Dorsum  Destruction method: cryotherapy   Informed consent: discussed and consent obtained   Lesion destroyed using liquid nitrogen: Yes   Region frozen until ice ball extended beyond lesion: Yes   Outcome: patient tolerated procedure well with no complications   Post-procedure details: wound care instructions given   Additional details:  Prior to procedure, discussed risks of blister formation, small wound, skin dyspigmentation, or rare scar following cryotherapy. Recommend Vaseline ointment to treated areas while healing.   Lichen sclerosus et atrophicus Bilateral Inframammary  Extragenital LS&A, no vaginal symptoms  Lichen sclerosus is a chronic inflammatory condition of unknown cause that frequently involves the vaginal area and less commonly extragenital skin, and is NOT sexually transmitted. It frequently causes symptoms of pain and burning.  It requires regular monitoring and treatment with topical steroids to minimize inflammation and to reduce risk of scarring. There is also a risk of cancer in the vaginal area which is very low if inflammation is well controlled. Regular checks of the area are recommended. Please call if you notice any new or changing spots within this area.   Discussed biopsy to confirm diagnosis. Patient defers today and prefers to treat topically.  Start Protopic 0.1% Ointment Apply BID to AA dsp 100g 1Rf.      tacrolimus (PROTOPIC) 0.1 % ointment - Bilateral Inframammary Apply to rash under breasts twice daily until improved.  Inflamed seborrheic keratosis Left Breast  Destruction of  lesion - Left Breast  Destruction method: cryotherapy   Informed consent: discussed and consent obtained   Lesion destroyed using liquid nitrogen: Yes   Region frozen until ice ball extended beyond lesion: Yes   Outcome: patient tolerated procedure well with no complications   Post-procedure details: wound care instructions given   Additional details:  Prior to procedure, discussed risks of blister formation, small wound, skin dyspigmentation, or rare scar following cryotherapy. Recommend Vaseline ointment to treated areas while healing.   Return in about 2 months (around 03/16/2021) for LS&A, alopecia.  IJamesetta Orleans, CMA, am acting as scribe for [name], MD . Documentation: I have reviewed the above documentation for accuracy and completeness, and I agree with the above.  [name] MD

## 2021-01-14 NOTE — Patient Instructions (Addendum)
Start Loniten (minoxidil) take 1/4 tablet starting daily, may increase to 1/2 tablet daily (prescription will be written 1 pill daily)  Cryotherapy Aftercare  Wash gently with soap and water everyday.   Apply Vaseline and Band-Aid daily until healed.    Lichen sclerosus et Atrophicus - rash under breasts  Start tacrolimus ointment twice daily under breasts and pink spot on right breast.   If you have any questions or concerns for your doctor, please call our main line at [phone] and press option 4 to reach your doctor's medical assistant. If no one answers, please leave a voicemail as directed and we will return your call as soon as possible. Messages left after 4 pm will be answered the following business day.   You may also send [place] a message via [place]. We typically respond to MyChart messages within 1-2 business days.  For prescription refills, please ask your pharmacy to contact our office. Our fax number is [phone].  If you have an urgent issue when the clinic is closed that cannot wait until the next business day, you can page your doctor at the number below.    Please note that while we do our best to be available for urgent issues outside of office hours, we are not available 24/7.   If you have an urgent issue and are unable to reach [place], you may choose to seek medical care at your doctor's office, retail clinic, urgent care center, or emergency room.  If you have a medical emergency, please immediately call 911 or go to the emergency department.  Pager Numbers  - Dr. Nehemiah Massed: [phone]  - Dr. Laurence Ferrari: [phone]  - Dr. Nicole Kindred: [phone]  In the event of inclement weather, please call our main line at [phone] for an update on the status of any delays or closures.  Dermatology Medication Tips: Please keep the boxes that topical medications come in in order to help keep track of the instructions about where and how to use these. Pharmacies typically  print the medication instructions only on the boxes and not directly on the medication tubes.   If your medication is too expensive, please contact our office at [phone] option 4 or send [place] a message through [place].   We are unable to tell what your co-pay for medications will be in advance as this is different depending on your insurance coverage. However, we may be able to find a substitute medication at lower cost or fill out paperwork to get insurance to cover a needed medication.   If a prior authorization is required to get your medication covered by your insurance company, please allow [place] 1-2 business days to complete this process.  Drug prices often vary depending on where the prescription is filled and some pharmacies may offer cheaper prices.  The website www.goodrx.com contains coupons for medications through different pharmacies. The prices here do not account for what the cost may be with help from insurance (it may be cheaper with your insurance), but the website can give you the price if you did not use any insurance.  - You can print the associated coupon and take it with your prescription to the pharmacy.  - You may also stop by our office during regular business hours and pick up a GoodRx coupon card.  - If you need your prescription sent electronically to a different pharmacy, notify our office through [facility] or by phone at [phone] option 4.

## 2021-01-21 ENCOUNTER — Other Ambulatory Visit: Payer: Self-pay

## 2021-01-21 ENCOUNTER — Ambulatory Visit (INDEPENDENT_AMBULATORY_CARE_PROVIDER_SITE_OTHER): Payer: Medicare Other | Admitting: Internal Medicine

## 2021-01-21 ENCOUNTER — Encounter: Payer: Self-pay | Admitting: Internal Medicine

## 2021-01-21 VITALS — BP 122/78 | HR 93 | Temp 96.2°F | Ht 65.0 in | Wt 207.2 lb

## 2021-01-21 DIAGNOSIS — E1159 Type 2 diabetes mellitus with other circulatory complications: Secondary | ICD-10-CM

## 2021-01-21 DIAGNOSIS — E1169 Type 2 diabetes mellitus with other specified complication: Secondary | ICD-10-CM

## 2021-01-21 DIAGNOSIS — E039 Hypothyroidism, unspecified: Secondary | ICD-10-CM | POA: Diagnosis not present

## 2021-01-21 DIAGNOSIS — E119 Type 2 diabetes mellitus without complications: Secondary | ICD-10-CM | POA: Diagnosis not present

## 2021-01-21 DIAGNOSIS — E782 Mixed hyperlipidemia: Secondary | ICD-10-CM | POA: Diagnosis not present

## 2021-01-21 DIAGNOSIS — I152 Hypertension secondary to endocrine disorders: Secondary | ICD-10-CM

## 2021-01-21 DIAGNOSIS — E669 Obesity, unspecified: Secondary | ICD-10-CM

## 2021-01-21 NOTE — Progress Notes (Signed)
Subjective:  Patient ID: Sheri Arnold, female    DOB: April 10, 1951  Age: 70 y.o. MRN: [id number]  CC: The primary encounter diagnosis was Hypothyroidism, unspecified type. Diagnoses of Type 2 diabetes mellitus without complication, without long-term current use of insulin ([place]), Obesity, diabetes, and hypertension syndrome (Sheri Arnold), and Mixed hyperlipidemia were also pertinent to this visit.  HPI Sheri Arnold presents for  Chief Complaint  Patient presents with   Follow-up    hypothyroidism    Last seen 2 years ago.   Has gained weight due to decreased activities providing suicide watch for her grandson.  Since her lab draw she has joined a gym and changed her diet.      Hypothyroid:  taking biotin at time of testing.  Weight gain discussed     Hair loss noted by dermatologist  Hyperliidemia;: Taking RYR and krill oil  in mid July,  does not want statin therapy  had a calcium score in 2014  results unknown but supposedly very  low. (Ordered by previous PCP ) .    Outpatient Medications Prior to Visit  Medication Sig Dispense Refill   betamethasone valerate (VALISONE) 0.1 % cream Apply topically daily. 45 g 1   KRILL OIL PO Take 2 capsules by mouth daily.     levothyroxine (SYNTHROID) 75 MCG tablet Take 1 tablet (75 mcg total) by mouth daily. 90 tablet 1   minoxidil (LONITEN) 2.5 MG tablet Take 1 tablet (2.5 mg total) by mouth daily. 30 tablet 3   Red Yeast Rice 600 MG CAPS Take 2 capsules by mouth daily.     tacrolimus (PROTOPIC) 0.1 % ointment Apply to rash under breasts twice daily until improved. 100 g 1   atorvastatin (LIPITOR) 40 MG tablet Take 1 tablet (40 mg total) by mouth daily. (Patient not taking: Reported on 01/21/2021) 90 tablet 4   No facility-administered medications prior to visit.    Review of Systems;  Patient denies headache, fevers, malaise, unintentional weight loss, skin rash, eye pain, sinus congestion and sinus pain, sore throat, dysphagia,  hemoptysis , cough,  dyspnea, wheezing, chest pain, palpitations, orthopnea, edema, abdominal pain, nausea, melena, diarrhea, constipation, flank pain, dysuria, hematuria, urinary  Frequency, nocturia, numbness, tingling, seizures,  Focal weakness, Loss of consciousness,  Tremor, insomnia, depression, anxiety, and suicidal ideation.      Objective:  BP 122/78 (BP Location: Left Arm, Patient Position: Sitting, Cuff Size: Large)   Pulse 93   Temp (!) 96.2 F (35.7 C) (Temporal)   Ht '5\' 5"'$  (1.651 m)   Wt 207 lb 3.2 oz (94 kg)   SpO2 95%   BMI 34.48 kg/m   BP Readings from Last 3 Encounters:  01/21/21 122/78  12/21/19 112/66  06/12/19 (!) 108/59    Wt Readings from Last 3 Encounters:  01/21/21 207 lb 3.2 oz (94 kg)  09/25/20 202 lb (91.6 kg)  08/21/20 204 lb (92.5 kg)    General appearance: alert, cooperative and appears stated age Ears: normal TM's and external ear canals both ears Throat: lips, mucosa, and tongue normal; teeth and gums normal Neck: no adenopathy, no carotid bruit, supple, symmetrical, trachea midline and thyroid not enlarged, symmetric, no tenderness/mass/nodules Back: symmetric, no curvature. ROM normal. No CVA tenderness. Lungs: clear to auscultation bilaterally Heart: regular rate and rhythm, S1, S2 normal, no murmur, click, rub or gallop Abdomen: soft, non-tender; bowel sounds normal; no masses,  no organomegaly Pulses: 2+ and symmetric Skin: Skin color, texture, turgor normal. No rashes  or lesions Lymph nodes: Cervical, supraclavicular, and axillary nodes normal.  No results found for: HGBA1C  Lab Results  Component Value Date   CREATININE 0.81 12/05/2020   CREATININE 0.69 06/20/2020   CREATININE 0.73 12/21/2019    Lab Results  Component Value Date   WBC 6.8 02/27/2019   HGB 13.1 02/27/2019   HCT 38.8 02/27/2019   PLT 296 02/27/2019   GLUCOSE 127 (H) 12/05/2020   CHOL 234 (H) 12/05/2020   TRIG 85.0 12/05/2020   HDL 68.00 12/05/2020   LDLCALC 149 (H) 12/05/2020    ALT 25 12/05/2020   AST 20 12/05/2020   NA 136 12/05/2020   K 4.5 12/05/2020   CL 104 12/05/2020   CREATININE 0.81 12/05/2020   BUN 16 12/05/2020   CO2 24 12/05/2020   TSH 1.82 12/05/2020    MM Digital Diagnostic Unilat L  Result Date: 06/08/2019 CLINICAL DATA:  Screening recall for possible left breast calcifications. EXAM: DIGITAL DIAGNOSTIC LEFT MAMMOGRAM WITH CAD COMPARISON:  Previous exam(s). ACR Breast Density Category c: The breast tissue is heterogeneously dense, which may obscure small masses. FINDINGS: Spot compression magnification images over the superior posterior left breast/low axilla demonstrates that the questioned calcifications do not persist. Their appearance on the prior mammogram appears to be artifactual. No suspicious calcifications, masses or areas of distortion are seen in the left breast. Mammographic images were processed with CAD. IMPRESSION: No persistent mammographic abnormalities are identified in the left breast. RECOMMENDATION: Screening mammogram in one year.(Code:SM-B-01Y) I have discussed the findings and recommendations with the patient. If applicable, a reminder letter will be sent to the patient regarding the next appointment. BI-RADS CATEGORY  1: Negative. Electronically Signed   By: Ammie Ferrier M.D.   On: 06/08/2019 14:42    Assessment & Plan:   Problem List Items Addressed This Visit       Unprioritized   Hypothyroidism - Primary    Thyroid function is WNL on current dose of 75 mcg daily .  No current changes needed.   Lab Results  Component Value Date   TSH 1.82 12/05/2020         Relevant Orders   TSH   Hyperlipidemia    She is tolerating statin. She is near goal LDL.  LFTS are normal today    Lab Results  Component Value Date   CHOL 234 (H) 12/05/2020   HDL 68.00 12/05/2020   LDLCALC 149 (H) 12/05/2020   TRIG 85.0 12/05/2020   CHOLHDL 3 12/05/2020   Lab Results  Component Value Date   ALT 25 12/05/2020   AST 20  12/05/2020   ALKPHOS 66 12/05/2020   BILITOT 0.4 12/05/2020         Obesity, diabetes, and hypertension syndrome ([place])    With obesity and hypertension. Fasting glucose was 127,,  But A1c 6.3 Weight gain attributed to inactivity.  I have addressed  BMI and recommended wt loss of 10% of body weight over the next 6 months using a low glycemic index diet and regular exercise a minimum of 5 days per week.   She has declined statin therapy.  Fasting labs ordered  No results found for: HGBA1C Lab Results  Component Value Date   LABMICR See below: 02/22/2018   LABMICR See below: 06/03/2017            Other Visit Diagnoses     Type 2 diabetes mellitus without complication, without long-term current use of insulin ([place])  Relevant Orders   POCT HgB A1C        Follow-up: Return for follow up diabetes.   Crecencio Mc, MD

## 2021-01-22 DIAGNOSIS — I152 Hypertension secondary to endocrine disorders: Secondary | ICD-10-CM | POA: Insufficient documentation

## 2021-01-22 DIAGNOSIS — E1159 Type 2 diabetes mellitus with other circulatory complications: Secondary | ICD-10-CM | POA: Insufficient documentation

## 2021-01-22 DIAGNOSIS — E1169 Type 2 diabetes mellitus with other specified complication: Secondary | ICD-10-CM | POA: Insufficient documentation

## 2021-01-22 LAB — POCT GLYCOSYLATED HEMOGLOBIN (HGB A1C): Hemoglobin A1C: 6.3 % — AB (ref 4.0–5.6)

## 2021-01-22 NOTE — Assessment & Plan Note (Addendum)
With obesity and hypertension. Fasting glucose was 127,,  But A1c 6.3 Weight gain attributed to inactivity.  I have addressed  BMI and recommended wt loss of 10% of body weight over the next 6 months using a low glycemic index diet and regular exercise a minimum of 5 days per week.   She has declined statin therapy.  Fasting labs ordered  Lab Results  Component Value Date   HGBA1C 6.3 (A) 01/22/2021   Lab Results  Component Value Date   LABMICR See below: 02/22/2018   LABMICR See below: 06/03/2017

## 2021-01-22 NOTE — Assessment & Plan Note (Signed)
Thyroid function is WNL on current dose of 75 mcg daily .  No current changes needed.   Lab Results  Component Value Date   TSH 1.82 12/05/2020

## 2021-01-22 NOTE — Assessment & Plan Note (Signed)
She is tolerating statin. She is near goal LDL.  LFTS are normal today    Lab Results  Component Value Date   CHOL 234 (H) 12/05/2020   HDL 68.00 12/05/2020   LDLCALC 149 (H) 12/05/2020   TRIG 85.0 12/05/2020   CHOLHDL 3 12/05/2020   Lab Results  Component Value Date   ALT 25 12/05/2020   AST 20 12/05/2020   ALKPHOS 66 12/05/2020   BILITOT 0.4 12/05/2020

## 2021-01-31 ENCOUNTER — Other Ambulatory Visit (INDEPENDENT_AMBULATORY_CARE_PROVIDER_SITE_OTHER): Payer: Medicare Other

## 2021-01-31 ENCOUNTER — Other Ambulatory Visit: Payer: Self-pay

## 2021-01-31 DIAGNOSIS — E039 Hypothyroidism, unspecified: Secondary | ICD-10-CM

## 2021-01-31 LAB — TSH: TSH: 1.32 u[IU]/mL (ref 0.35–5.50)

## 2021-02-04 ENCOUNTER — Other Ambulatory Visit: Payer: Self-pay | Admitting: Internal Medicine

## 2021-02-04 DIAGNOSIS — Z1231 Encounter for screening mammogram for malignant neoplasm of breast: Secondary | ICD-10-CM

## 2021-02-20 ENCOUNTER — Other Ambulatory Visit: Payer: Self-pay

## 2021-02-20 ENCOUNTER — Ambulatory Visit
Admission: RE | Admit: 2021-02-20 | Discharge: 2021-02-20 | Disposition: A | Discharge status: Home / Self Care | Payer: Medicare Other | Source: Ambulatory Visit | Attending: Internal Medicine | Admitting: Internal Medicine

## 2021-02-20 DIAGNOSIS — Z1231 Encounter for screening mammogram for malignant neoplasm of breast: Secondary | ICD-10-CM | POA: Diagnosis not present

## 2021-03-18 ENCOUNTER — Other Ambulatory Visit: Payer: Self-pay

## 2021-03-18 ENCOUNTER — Ambulatory Visit (INDEPENDENT_AMBULATORY_CARE_PROVIDER_SITE_OTHER): Payer: Medicare Other | Admitting: Dermatology

## 2021-03-18 DIAGNOSIS — L900 Lichen sclerosus et atrophicus: Secondary | ICD-10-CM

## 2021-03-18 DIAGNOSIS — L649 Androgenic alopecia, unspecified: Secondary | ICD-10-CM

## 2021-03-18 NOTE — Patient Instructions (Addendum)
Vtama sample apply topically at night for rash at under breast Opzelura - apply twice daily to rash under breast   Call and let us know if you want to send in prescription for 1 of samples given    If you have any questions or concerns for your doctor, please call our main line at [phone] and press option 4 to reach your doctor's medical assistant. If no one answers, please leave a voicemail as directed and we will return your call as soon as possible. Messages left after 4 pm will be answered the following business day.   You may also send [place] a message via [place]. We typically respond to MyChart messages within 1-2 business days.  For prescription refills, please ask your pharmacy to contact our office. Our fax number is [phone].  If you have an urgent issue when the clinic is closed that cannot wait until the next business day, you can page your doctor at the number below.    Please note that while we do our best to be available for urgent issues outside of office hours, we are not available 24/7.   If you have an urgent issue and are unable to reach [place], you may choose to seek medical care at your doctor's office, retail clinic, urgent care center, or emergency room.  If you have a medical emergency, please immediately call 911 or go to the emergency department.  Pager Numbers  - Dr. Nehemiah Massed: [phone]  - Dr. Laurence Ferrari: [phone]  - Dr. Nicole Kindred: [phone]  In the event of inclement weather, please call our main line at [phone] for an update on the status of any delays or closures.  Dermatology Medication Tips: Please keep the boxes that topical medications come in in order to help keep track of the instructions about where and how to use these. Pharmacies typically print the medication instructions only on the boxes and not directly on the medication tubes.   If your medication is too expensive, please contact our office at [phone] option 4 or send [place] a  message through [place].   We are unable to tell what your co-pay for medications will be in advance as this is different depending on your insurance coverage. However, we may be able to find a substitute medication at lower cost or fill out paperwork to get insurance to cover a needed medication.   If a prior authorization is required to get your medication covered by your insurance company, please allow [place] 1-2 business days to complete this process.  Drug prices often vary depending on where the prescription is filled and some pharmacies may offer cheaper prices.  The website www.goodrx.com contains coupons for medications through different pharmacies. The prices here do not account for what the cost may be with help from insurance (it may be cheaper with your insurance), but the website can give you the price if you did not use any insurance.  - You can print the associated coupon and take it with your prescription to the pharmacy.  - You may also stop by our office during regular business hours and pick up a GoodRx coupon card.  - If you need your prescription sent electronically to a different pharmacy, notify our office through [facility] or by phone at [phone] option 4.

## 2021-03-18 NOTE — Progress Notes (Signed)
   Follow-Up Visit   Subjective  Sheri Arnold is a 70 y.o. female who presents for the following: Follow-up (Patient here today for 2 month follow up on androgenetic alopecia. She reports she feels hair has improved since taking minoxidil 2.5 mg tablets and has not noticed any side effects. She is also here for follow up on lichen sclerosis et atrophicus at b/l inframammary. She reports protopic is helping but very slowly. She denies others concerns at this time. ).  She is taking 1/4 of minoxidil tabs.  No dizziness, light-headedness, no cough.  BP has been normal whenever checked.   The following portions of the chart were reviewed this encounter and updated as appropriate:      Review of Systems: No other skin or systemic complaints except as noted in HPI or Assessment and Plan.   Objective  Well appearing patient in no apparent distress; mood and affect are within normal limits.  A focused examination was performed including scalp and bilateral inframammary folds. Relevant physical exam findings are noted in the Assessment and Plan.  Scalp Diffuse thinning of the crown and widening of the midline part with retention of the frontal hairline - Reviewed progressive nature and prognosis.            bilateral inframammary folds Atrophic white/pink patches as well as some hypertrophic white scaly plaques.  Pt denies any vaginal symptoms         Assessment & Plan  Androgenic alopecia Scalp  Chronic condition related to genetics and hormonal changes associated with menopause causing hair thinning primarily on the crown with widening of the part and temporal hairline recession.  Can try OTC Rogaine (minoxidil) 5% solution/foam as directed. Patient not interested in topical treatment, would prefer oral version.  Has improved per pt- new photos today   Patient currently taking 1/4 of loniten 2.5 mg tablet. Instructed to Increase Loniten 2.5 mg  1/2 if comfortable. (Rx written  1 po QD) dsp #30 3Rf.   Discussed potential s.e. 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 It frequently causes symptoms of pain and burning.  It requires regular monitoring and treatment with topical steroids to minimize inflammation and to reduce risk of scarring. There is also a risk of cancer in the vaginal area which is very low if inflammation is well controlled. Regular checks of the area are recommended. Please call if you notice any new or changing spots within this area.     Hold Protopic 0.1% Ointment Apply BID to AA dsp 100g 1Rf.   Start opzelura sample to affected areas under breast twice daily, samples x 2 Start sample vtama once daily at night to rash under breasts  Samples x 2  Patient instructed to inform us of which sample is working and we will send prescription of that medication.   Related Medications tacrolimus (PROTOPIC) 0.1 % ointment Apply to rash under breasts twice daily until improved.  Return for 3 month lsa and alopecia follow up . I, Ruthell Rummage, CMA, am acting as scribe for [name], MD.  Documentation: I have reviewed the above documentation for accuracy and completeness, and I agree with the above.  [name] MD

## 2021-04-28 ENCOUNTER — Encounter: Payer: Self-pay | Admitting: Internal Medicine

## 2021-04-28 ENCOUNTER — Ambulatory Visit (INDEPENDENT_AMBULATORY_CARE_PROVIDER_SITE_OTHER): Payer: Medicare Other | Admitting: Internal Medicine

## 2021-04-28 ENCOUNTER — Other Ambulatory Visit: Payer: Self-pay

## 2021-04-28 VITALS — BP 122/68 | HR 68 | Temp 96.1°F | Ht 65.0 in | Wt 202.6 lb

## 2021-04-28 DIAGNOSIS — E1159 Type 2 diabetes mellitus with other circulatory complications: Secondary | ICD-10-CM | POA: Diagnosis not present

## 2021-04-28 DIAGNOSIS — I152 Hypertension secondary to endocrine disorders: Secondary | ICD-10-CM | POA: Diagnosis not present

## 2021-04-28 DIAGNOSIS — E669 Obesity, unspecified: Secondary | ICD-10-CM | POA: Diagnosis not present

## 2021-04-28 DIAGNOSIS — E782 Mixed hyperlipidemia: Secondary | ICD-10-CM

## 2021-04-28 DIAGNOSIS — E119 Type 2 diabetes mellitus without complications: Secondary | ICD-10-CM

## 2021-04-28 DIAGNOSIS — E1169 Type 2 diabetes mellitus with other specified complication: Secondary | ICD-10-CM

## 2021-04-28 MED ORDER — ZOSTER VAC RECOMB ADJUVANTED 50 MCG/0.5ML IM SUSR: 0.5000 mL | Freq: Once | INTRAMUSCULAR | 1 refills | Status: AC

## 2021-04-28 NOTE — Patient Instructions (Signed)
Good to see you  If your A1c  is below 5.8:  see you in a year !

## 2021-04-28 NOTE — Progress Notes (Signed)
Subjective:  Patient ID: Sheri Arnold, female    DOB: 10-Mar-1951  Age: 70 y.o. MRN: [id number]  CC: The primary encounter diagnosis was Mixed hyperlipidemia. Diagnoses of Type 2 diabetes mellitus without complication, without long-term current use of insulin (HCC) and Obesity, diabetes, and hypertension syndrome ([place]) were also pertinent to this visit.  HPI Sheri Arnold presents for  Chief Complaint  Patient presents with   Follow-up    Follow up on diabetes   This visit occurred during the SARS-CoV-2 public health emergency.  Safety protocols were in place, including screening questions prior to the visit, additional usage of staff PPE, and extensive cleaning of exam room while observing appropriate contact time as indicated for disinfecting solutions.   Type 2 Dm:  diagnosed July 2022 with fasting glucose of 127 ,  a1c < 6.5  .  She has been working out daily at a gym and using intermittent fasting  diet to modify her caloric intake.  She has lost 5 lbs by home scales.  Not checking sugars  taking krill  oil and RYR  as preferred therapy vs statin which was advised for management of cholesterol   Outpatient Medications Prior to Visit  Medication Sig Dispense Refill   betamethasone valerate (VALISONE) 0.1 % cream Apply topically daily. 45 g 1   KRILL OIL PO Take 2 capsules by mouth daily.     levothyroxine (SYNTHROID) 75 MCG tablet Take 1 tablet (75 mcg total) by mouth daily. 90 tablet 1   minoxidil (LONITEN) 2.5 MG tablet Take 1 tablet (2.5 mg total) by mouth daily. 30 tablet 3   Red Yeast Rice 600 MG CAPS Take 2 capsules by mouth daily.     atorvastatin (LIPITOR) 40 MG tablet Take 1 tablet (40 mg total) by mouth daily. (Patient not taking: Reported on 03/18/2021) 90 tablet 4   tacrolimus (PROTOPIC) 0.1 % ointment Apply to rash under breasts twice daily until improved. (Patient not taking: Reported on 04/28/2021) 100 g 1   No facility-administered medications prior to visit.    Review  of Systems;  Patient denies headache, fevers, malaise, unintentional weight loss, skin rash, eye pain, sinus congestion and sinus pain, sore throat, dysphagia,  hemoptysis , cough, dyspnea, wheezing, chest pain, palpitations, orthopnea, edema, abdominal pain, nausea, melena, diarrhea, constipation, flank pain, dysuria, hematuria, urinary  Frequency, nocturia, numbness, tingling, seizures,  Focal weakness, Loss of consciousness,  Tremor, insomnia, depression, anxiety, and suicidal ideation.      Objective:  BP 122/68 (BP Location: Left Arm, Patient Position: Sitting, Cuff Size: Large)   Pulse 68   Temp (!) 96.1 F (35.6 C) (Temporal)   Ht 5\' 5"  (1.651 m)   Wt 202 lb 9.6 oz (91.9 kg)   SpO2 96%   BMI 33.71 kg/m   BP Readings from Last 3 Encounters:  04/28/21 122/68  01/21/21 122/78  12/21/19 112/66    Wt Readings from Last 3 Encounters:  04/28/21 202 lb 9.6 oz (91.9 kg)  01/21/21 207 lb 3.2 oz (94 kg)  09/25/20 202 lb (91.6 kg)    General appearance: alert, cooperative and appears stated age Ears: normal TM's and external ear canals both ears Throat: lips, mucosa, and tongue normal; teeth and gums normal Neck: no adenopathy, no carotid bruit, supple, symmetrical, trachea midline and thyroid not enlarged, symmetric, no tenderness/mass/nodules Back: symmetric, no curvature. ROM normal. No CVA tenderness. Lungs: clear to auscultation bilaterally Heart: regular rate and rhythm, S1, S2 normal, no murmur, click, rub  or gallop Abdomen: soft, non-tender; bowel sounds normal; no masses,  no organomegaly Pulses: 2+ and symmetric Skin: Skin color, texture, turgor normal. No rashes or lesions Lymph nodes: Cervical, supraclavicular, and axillary nodes normal.  Lab Results  Component Value Date   HGBA1C 6.3 (A) 01/22/2021    Lab Results  Component Value Date   CREATININE 0.81 12/05/2020   CREATININE 0.69 06/20/2020   CREATININE 0.73 12/21/2019    Lab Results  Component Value  Date   WBC 6.8 02/27/2019   HGB 13.1 02/27/2019   HCT 38.8 02/27/2019   PLT 296 02/27/2019   GLUCOSE 127 (H) 12/05/2020   CHOL 234 (H) 12/05/2020   TRIG 85.0 12/05/2020   HDL 68.00 12/05/2020   LDLCALC 149 (H) 12/05/2020   ALT 25 12/05/2020   AST 20 12/05/2020   NA 136 12/05/2020   K 4.5 12/05/2020   CL 104 12/05/2020   CREATININE 0.81 12/05/2020   BUN 16 12/05/2020   CO2 24 12/05/2020   TSH 1.32 01/31/2021   HGBA1C 6.3 (A) 01/22/2021    MM 3D SCREEN BREAST BILATERAL  Result Date: 02/26/2021 CLINICAL DATA:  Screening. EXAM: DIGITAL SCREENING BILATERAL MAMMOGRAM WITH TOMOSYNTHESIS AND CAD TECHNIQUE: Bilateral screening digital craniocaudal and mediolateral oblique mammograms were obtained. Bilateral screening digital breast tomosynthesis was performed. The images were evaluated with computer-aided detection. COMPARISON:  Previous exam(s). ACR Breast Density Category b: There are scattered areas of fibroglandular density. FINDINGS: There are no findings suspicious for malignancy. IMPRESSION: No mammographic evidence of malignancy. A result letter of this screening mammogram will be mailed directly to the patient. RECOMMENDATION: Screening mammogram in one year. (Code:SM-B-01Y) BI-RADS CATEGORY  1: Negative. Electronically Signed   By: Audie Pinto M.D.   On: 02/26/2021 12:00   Assessment & Plan:   Problem List Items Addressed This Visit     Hyperlipidemia - Primary   Relevant Orders   Lipid panel   Obesity, diabetes, and hypertension syndrome ([place])    With obesity and hypertension. 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   She is motivated to cure herself of the diagnosis of diabetes and   has declined statin therapy. 3 hour fasting labs and urine screen for proteinuria  Ordered.  Pneumonia vaccine declined   Lab Results  Component Value Date    HGBA1C 6.3 (A) 01/22/2021   Lab Results  Component Value Date   LABMICR See below: 02/22/2018   LABMICR See below: 06/03/2017            Other Visit Diagnoses     Type 2 diabetes mellitus without complication, without long-term current use of insulin (HCC)       Relevant Orders   Microalbumin / creatinine urine ratio   Hemoglobin A1c   Comprehensive metabolic panel      Meds ordered this encounter  Medications   Zoster Vaccine Adjuvanted [facility]) injection    Sig: Inject 0.5 mLs into the muscle once for 1 dose.    Dispense:  1 each    Refill:  1    Medications Discontinued During This Encounter  Medication Reason   tacrolimus (PROTOPIC) 0.1 % ointment     Follow-up: No follow-ups on file.   Crecencio Mc, MD

## 2021-04-29 LAB — LIPID PANEL
Cholesterol: 287 mg/dL — ABNORMAL HIGH (ref 0–200)
HDL: 64.7 mg/dL (ref >39.00)
NonHDL: 222.14
Total CHOL/HDL Ratio: 4
Triglycerides: 239 mg/dL — ABNORMAL HIGH (ref 0.0–149.0)
VLDL: 47.8 mg/dL — ABNORMAL HIGH (ref 0.0–40.0)

## 2021-04-29 LAB — MICROALBUMIN / CREATININE URINE RATIO
Creatinine,U: 102.9 mg/dL
Microalb Creat Ratio: 0.7 mg/g (ref 0.0–30.0)
Microalb, Ur: <0.7 mg/dL (ref 0.0–1.9)

## 2021-04-29 LAB — COMPREHENSIVE METABOLIC PANEL
ALT: 34 U/L (ref 0–35)
AST: 26 U/L (ref 0–37)
Albumin: 4.2 g/dL (ref 3.5–5.2)
Alkaline Phosphatase: 66 U/L (ref 39–117)
BUN: 15 mg/dL (ref 6–23)
CO2: 27 mEq/L (ref 19–32)
Calcium: 9.9 mg/dL (ref 8.4–10.5)
Chloride: 102 mEq/L (ref 96–112)
Creatinine, Ser: 0.73 mg/dL (ref 0.40–1.20)
GFR: 84.62 mL/min (ref >60.00)
Glucose, Bld: 99 mg/dL (ref 70–99)
Potassium: 4.7 mEq/L (ref 3.5–5.1)
Sodium: 137 mEq/L (ref 135–145)
Total Bilirubin: 0.3 mg/dL (ref 0.2–1.2)
Total Protein: 7.4 g/dL (ref 6.0–8.3)

## 2021-04-29 LAB — LDL CHOLESTEROL, DIRECT: Direct LDL: 194 mg/dL

## 2021-04-29 LAB — HEMOGLOBIN A1C: Hgb A1c MFr Bld: 6.6 % — ABNORMAL HIGH (ref 4.6–6.5)

## 2021-04-29 NOTE — Assessment & Plan Note (Addendum)
With obesity and hypertension. 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   She is motivated to cure herself of the diagnosis of diabetes and   has declined statin therapy. 3 hour fasting labs and urine screen for proteinuria  Ordered.  Pneumonia vaccine declined   Lab Results  Component Value Date   HGBA1C 6.3 (A) 01/22/2021   Lab Results  Component Value Date   LABMICR See below: 02/22/2018   LABMICR See below: 06/03/2017

## 2021-05-02 ENCOUNTER — Ambulatory Visit: Payer: Medicare Other | Admitting: Internal Medicine

## 2021-05-06 MED ORDER — LEVOTHYROXINE SODIUM 75 MCG PO TABS: 75.0000 ug | ORAL_TABLET | Freq: Every day | ORAL | 3 refills | Status: DC

## 2021-05-06 MED ORDER — ATORVASTATIN CALCIUM 40 MG PO TABS: 40.0000 mg | ORAL_TABLET | Freq: Every day | ORAL | 4 refills | Status: DC

## 2021-05-06 NOTE — Addendum Note (Signed)
Addended by: Crecencio Mc on: 05/06/2021 04:26 PM   Modules accepted: Orders

## 2021-05-07 ENCOUNTER — Other Ambulatory Visit: Payer: Self-pay | Admitting: Internal Medicine

## 2021-05-07 DIAGNOSIS — E1159 Type 2 diabetes mellitus with other circulatory complications: Secondary | ICD-10-CM

## 2021-05-07 DIAGNOSIS — I152 Hypertension secondary to endocrine disorders: Secondary | ICD-10-CM

## 2021-06-11 ENCOUNTER — Telehealth: Payer: Self-pay | Admitting: Internal Medicine

## 2021-06-11 DIAGNOSIS — R1013 Epigastric pain: Secondary | ICD-10-CM | POA: Diagnosis not present

## 2021-06-11 NOTE — Telephone Encounter (Signed)
Pt has tingling in both arms, stomach pain and bloating for a couple of days. Pt wanted an EKG sent to access nurse

## 2021-06-12 NOTE — Telephone Encounter (Signed)
Spoke with pt and she stated that she went to [facility] and had a EKG done. She stated that the EKG was good and that she now feels fine.

## 2021-06-30 ENCOUNTER — Other Ambulatory Visit: Payer: Self-pay

## 2021-06-30 ENCOUNTER — Ambulatory Visit (INDEPENDENT_AMBULATORY_CARE_PROVIDER_SITE_OTHER): Payer: Medicare Other | Admitting: Dermatology

## 2021-06-30 DIAGNOSIS — L821 Other seborrheic keratosis: Secondary | ICD-10-CM | POA: Diagnosis not present

## 2021-06-30 DIAGNOSIS — L900 Lichen sclerosus et atrophicus: Secondary | ICD-10-CM | POA: Diagnosis not present

## 2021-06-30 DIAGNOSIS — L649 Androgenic alopecia, unspecified: Secondary | ICD-10-CM

## 2021-06-30 MED ORDER — MINOXIDIL 2.5 MG PO TABS: 2.5000 mg | ORAL_TABLET | Freq: Every day | ORAL | 1 refills | Status: DC

## 2021-06-30 NOTE — Patient Instructions (Signed)

## 2021-06-30 NOTE — Progress Notes (Signed)
° °  Follow-Up Visit   Subjective  Sheri Arnold is a 71 y.o. female who presents for the following: Alopecia (Sclap, Loniten 2.5mg  1/4 po qd) and LS&A (Bil inframammary folds, Betamethasone valerate qd/bid). She feels that her hair is thicker since starting oral minoxidil.  She also feels the Betamethasone valerate has helped more than anything.  Protopic did not work at all. She tried Opzelura and Vtama samples and didn't notice much difference from the topical steroid.  The following portions of the chart were reviewed this encounter and updated as appropriate:       Review of Systems:  No other skin or systemic complaints except as noted in HPI or Assessment and Plan.  Objective  Well appearing patient in no apparent distress; mood and affect are within normal limits.  A focused examination was performed including scalp, inframammary. Relevant physical exam findings are noted in the Assessment and Plan.  Scalp Diffuse thinning of the crown and widening of the midline part with retention of the frontal hairline, stable when compared to baseline photos Pt has noticed improvement  BP 111/66     bil inframammory/upper abdomen Atrophic white/pink patches as well as some hypertrophic white scaly papules/plaques, stable when compared to photos  Scalp Stuck-on, waxy, tan-brown patch          Assessment & Plan  Androgenic alopecia Scalp  Stable, some improvement and pt is satisfied  Chronic condition related to genetics and hormonal changes associated with menopause causing hair thinning primarily on the crown with widening of the part and temporal hairline recession.  May cont Loniten 2.5mg  1/4 po qd or may increase to Loniten 2.5mg  1/2 po qd as tolerated (written 1 PO qd)   minoxidil (LONITEN) 2.5 MG tablet - Scalp Take 1 tablet (2.5 mg total) by mouth daily.  Lichen sclerosus et atrophicus bil inframammory/upper abdomen  Extragenital LS&A- stable  Lichen  sclerosus is a chronic inflammatory condition of unknown cause that frequently involves the vaginal area and less commonly extragenital skin, and is NOT sexually transmitted. It frequently causes symptoms of pain and burning.  It requires regular monitoring and treatment with topical steroids to minimize inflammation and to reduce risk of scarring. There is also a risk of cancer in the vaginal area which is very low if inflammation is well controlled. Regular checks of the area are recommended. Please call if you notice any new or changing spots within this area.  Decrease Betamethasone valerate to qd aa (pt has), avoid body folds, Caution atrophy with long-term use.  Start Zoryve qd aa rash until clear, then prn flares, sample x 2 given, pt can call for prescription Lot [id number],exp 08/2022  Seborrheic keratosis Scalp  Benign-appearing, observe  Recheck on f/up   Return in about 6 months (around 12/28/2021) for alopecia, LS&A.  I, Othelia Pulling, RMA, am acting as scribe for [name], MD .  Documentation: I have reviewed the above documentation for accuracy and completeness, and I agree with the above.  [name] MD

## 2021-08-20 ENCOUNTER — Telehealth: Payer: Self-pay | Admitting: Internal Medicine

## 2021-08-20 NOTE — Telephone Encounter (Signed)
S/w pt - just wanted to see if she was having blood work done at visit - does not want to make 2 trips to have blood work done prior. ?Pt advised come fasting, will do labs at visit. No problem. ?

## 2021-08-20 NOTE — Telephone Encounter (Signed)
Pt called wanting to see if she can get her lab work done before her 4/6 appointment ?

## 2021-08-22 ENCOUNTER — Ambulatory Visit: Payer: Medicare Other

## 2021-08-28 ENCOUNTER — Ambulatory Visit (INDEPENDENT_AMBULATORY_CARE_PROVIDER_SITE_OTHER): Payer: Medicare Other

## 2021-08-28 VITALS — Ht 65.0 in | Wt 198.0 lb

## 2021-08-28 DIAGNOSIS — Z0000 Encounter for general adult medical examination without abnormal findings: Secondary | ICD-10-CM

## 2021-08-28 NOTE — Patient Instructions (Addendum)
?  Sheri Arnold , ?Thank you for taking time to come for your Medicare Wellness Visit. I appreciate your ongoing commitment to your health goals. Please review the following plan we discussed and let me know if I can assist you in the future.  ? ?These are the goals we discussed: ? Goals   ? ?  Maintain Healthy Lifestyle   ? ?  ?  ?This is a list of the screening recommended for you and due dates:  ?Health Maintenance  ?Topic Date Due  ? Eye exam for diabetics  Never done  ? COVID-19 Vaccine (4 - Booster for Pfizer series) 09/13/2021*  ? Pneumonia Vaccine (1 - PCV) 08/29/2022*  ? DEXA scan (bone density measurement)  09/23/2022*  ? Hemoglobin A1C  10/27/2021  ? Flu Shot  12/23/2021  ? Complete foot exam   04/28/2022  ? Colon Cancer Screening  06/11/2022  ? Mammogram  02/21/2023  ? Tetanus Vaccine  02/24/2027  ? Hepatitis C Screening: USPSTF Recommendation to screen - Ages 41-79 yo.  Completed  ? Zoster (Shingles) Vaccine  Completed  ? HPV Vaccine  Aged Out  ?*Topic was postponed. The date shown is not the original due date.  ?  ?

## 2021-08-28 NOTE — Progress Notes (Addendum)
Subjective:   Sheri Arnold is a 71 y.o. female who presents for Medicare Annual (Subsequent) preventive examination.  Review of Systems    No ROS.  Medicare Wellness Virtual Visit.  Visual/audio telehealth visit, UTA vital signs.   See social history for additional risk factors.   Cardiac Risk Factors include: advanced age (>89men, >54 women)     Objective:    Today's Vitals   08/28/21 0950  Weight: 198 lb (89.8 kg)  Height: 5\' 5"  (1.651 m)   Body mass index is 32.95 kg/m.     08/28/2021    9:57 AM 08/21/2020   12:41 PM 08/21/2019   12:40 PM 06/12/2019    9:48 AM  Advanced Directives  Does Patient Have a Medical Advance Directive? Yes Yes Yes Yes  Type of Estate agent of [place];Living will Healthcare Power of [employer] of [place];Living will Healthcare Power of [place];Living will;Out of facility DNR (pink MOST or yellow form)  Does patient want to make changes to medical advance directive? No - Patient declined No - Patient declined No - Patient declined   Copy of Healthcare Power of Attorney in Chart? No - copy requested No - copy requested No - copy requested Yes - validated most recent copy scanned in chart (See row information)    Current Medications (verified) Outpatient Encounter Medications as of 08/28/2021  Medication Sig   betamethasone valerate (VALISONE) 0.1 % cream Apply topically daily.   KRILL OIL PO Take 2 capsules by mouth daily.   levothyroxine (SYNTHROID) 75 MCG tablet Take 1 tablet (75 mcg total) by mouth daily.   minoxidil (LONITEN) 2.5 MG tablet Take 1 tablet (2.5 mg total) by mouth daily.   minoxidil (LONITEN) 2.5 MG tablet Take 1 tablet (2.5 mg total) by mouth daily.   Red Yeast Rice 600 MG CAPS Take 2 capsules by mouth daily.   No facility-administered encounter medications on file as of 08/28/2021.    Allergies (verified) Epinephrine   History: Past Medical History:  Diagnosis Date   Hx of dysplastic  nevus 08/22/2010   mid abdomen   Hyperlipidemia    Hypothyroidism    Past Surgical History:  Procedure Laterality Date   COLONOSCOPY     COLONOSCOPY WITH PROPOFOL N/A 06/12/2019   Procedure: COLONOSCOPY WITH PROPOFOL;  Surgeon: Toney Reil, MD;  Location: [facility];  Service: Gastroenterology;  Laterality: N/A;   EYE SURGERY     Tummy Tuck  2010   Family History  Problem Relation Age of Onset   Diabetes Mother    Heart disease Father    Breast cancer Maternal Aunt    Hyperlipidemia Sister    Hyperlipidemia Brother    Breast cancer Cousin    Social History   Socioeconomic History   Marital status: Widowed    Spouse name: Not on file   Number of children: Not on file   Years of education: Not on file   Highest education level: Not on file  Occupational History   Not on file  Tobacco Use   Smoking status: Never   Smokeless tobacco: Never  Vaping Use   Vaping Use: Never used  Substance and Sexual Activity   Alcohol use: Yes    Alcohol/week: 7.0 standard drinks    Types: 7 Shots of liquor per week   Drug use: No   Sexual activity: Not on file  Other Topics Concern   Not on file  Social History Narrative   Not on  file   Social Determinants of Health   Financial Resource Strain: Low Risk    Difficulty of Paying Living Expenses: Not hard at all  Food Insecurity: No Food Insecurity   Worried About Programme researcher, broadcasting/film/video in the Last Year: Never true   Barista in the Last Year: Never true  Transportation Needs: No Transportation Needs   Lack of Transportation (Medical): No   Lack of Transportation (Non-Medical): No  Physical Activity: Sufficiently Active   Days of Exercise per Week: 7 days   Minutes of Exercise per Session: 30 min  Stress: No Stress Concern Present   Feeling of Stress : Not at all  Social Connections: Moderately Integrated   Frequency of Communication with Friends and Family: More than three times a week   Frequency of Social  Gatherings with Friends and Family: More than three times a week   Attends Religious Services: More than 4 times per year   Active Member of [employer] or Organizations: Yes   Attends Banker Meetings: More than 4 times per year   Marital Status: Widowed    Tobacco Counseling Counseling given: Not Answered   Clinical Intake:  Pre-visit preparation completed: Yes        Diabetes:  (Followed by PCP)  How often do you need to have someone help you when you read instructions, pamphlets, or other written materials from your doctor or pharmacy?: 1 - Never    Interpreter Needed?: No     Activities of Daily Living    08/28/2021    9:53 AM  In your present state of health, do you have any difficulty performing the following activities:  Hearing? 0  Vision? 0  Difficulty concentrating or making decisions? 0  Walking or climbing stairs? 0  Dressing or bathing? 0  Doing errands, shopping? 0  Preparing Food and eating ? N  Using the Toilet? N  In the past six months, have you accidently leaked urine? N  Do you have problems with loss of bowel control? N  Managing your Medications? N  Managing your Finances? N  Housekeeping or managing your Housekeeping? N    Patient Care Team: Sherlene Shams, MD as PCP - General (Internal Medicine)  Indicate any recent Medical Services you may have received from other than [facility] providers in the past year (date may be approximate).     Assessment:   This is a routine wellness examination for [place].  Virtual Visit via Telephone Note  I connected with  Sheri Arnold on 08/28/21 at  9:45 AM EDT by telephone and verified that I am speaking with the correct person using two identifiers.  Persons participating in the virtual visit: patient/Nurse Health Advisor   I discussed the limitations of performing an evaluation and management service by telehealth. The patient expressed understanding and agreed to proceed. We continued and  completed visit with audio only. Some vital signs may be absent or patient reported.   Hearing/Vision screen Hearing Screening - Comments:: Patient is able to hear conversational tones without difficulty. No issues reported. Vision Screening - Comments:: Followed by Dr. Clydene Pugh  They have seen their ophthalmologist in the last 12 months.   Dietary issues and exercise activities discussed: Current Exercise Habits: Home exercise routine, Type of exercise: walking, Time (Minutes): 45, Intensity: Mild   Goals Addressed             This Visit's Progress    Maintain Healthy Lifestyle  Depression Screen    08/28/2021    9:53 AM 04/28/2021    3:58 PM 01/21/2021    4:32 PM 09/25/2020   10:59 AM 08/21/2020   12:34 PM 08/21/2019   12:42 PM 06/05/2019    9:57 AM  PHQ 2/9 Scores  PHQ - 2 Score 0 0 0 0 0 0 0  PHQ- 9 Score       0    Fall Risk    08/28/2021    9:53 AM 04/28/2021    3:58 PM 01/21/2021    4:31 PM 09/25/2020   10:59 AM 08/21/2020   12:42 PM  Fall Risk   Falls in the past year? 0 0 0 0 0  Number falls in past yr: 0   0 0  Injury with Fall?    0 0  Risk for fall due to :  No Fall Risks     Follow up Falls evaluation completed Falls evaluation completed Falls evaluation completed Falls evaluation completed Falls evaluation completed   FALL RISK PREVENTION PERTAINING TO THE HOME: Home free of loose throw rugs in walkways, pet beds, electrical cords, etc? Yes  Adequate lighting in your home to reduce risk of falls? Yes   ASSISTIVE DEVICES UTILIZED TO PREVENT FALLS: Life alert? No  Use of a cane, walker or w/c? No   TIMED UP AND GO: Was the test performed? No .   Cognitive Function:  Patient is alert and oriented x3.  Enjoys brain health stimulating activities.       08/21/2019   12:56 PM  6CIT Screen  What Year? 0 points  What month? 0 points  What time? 0 points    Immunizations Immunization History  Administered Date(s) Administered   Influenza,inj,Quad  PF,6+ Mos 02/16/2017   Influenza-Unspecified 03/05/2015, 03/25/2016   MMR 11/22/2018, 03/06/2019   PFIZER(Purple Top)SARS-COV-2 Vaccination 07/08/2019, 07/31/2019, 02/23/2020   Td 01/14/2007, 02/23/2017   Zoster Recombinat (Shingrix) 06/03/2021, 08/11/2021   Pneumococcal vaccine status: Declined,  Education has been provided regarding the importance of this vaccine but patient still declined. Advised may receive this vaccine at local pharmacy or Health Dept. Aware to provide a copy of the vaccination record if obtained from local pharmacy or Health Dept. Verbalized acceptance and understanding.   Screening Tests Health Maintenance  Topic Date Due   OPHTHALMOLOGY EXAM  Never done   COVID-19 Vaccine (4 - Booster for Pfizer series) 09/13/2021 (Originally 04/19/2020)   Pneumonia Vaccine 49+ Years old (1 - PCV) 08/29/2022 (Originally 02/06/2018)   DEXA SCAN  09/23/2022 (Originally 02/06/2018)   HEMOGLOBIN A1C  10/27/2021   INFLUENZA VACCINE  12/23/2021   FOOT EXAM  04/28/2022   COLONOSCOPY (Pts 45-46yrs Insurance coverage will need to be confirmed)  06/11/2022   MAMMOGRAM  02/21/2023   TETANUS/TDAP  02/24/2027   Hepatitis C Screening  Completed   Zoster Vaccines- Shingrix  Completed   HPV VACCINES  Aged Out   Health Maintenance Health Maintenance Due  Topic Date Due   OPHTHALMOLOGY EXAM  Never done   Bone density- deferred per patient request.  Lung Cancer Screening: (Low Dose CT Chest recommended if Age 48-80 years, 30 pack-year currently smoking OR have quit w/in 15years.) does not qualify.   Vision Screening: Recommended annual ophthalmology exams for early detection of glaucoma and other disorders of the eye. Reports annual exam scheduled.   Dental Screening: Recommended annual dental exams for proper oral hygiene  Community Resource Referral / Chronic Care Management: CRR required this visit?  No   CCM required this visit?  No      Plan:   Keep all routine maintenance  appointments.   Fasting lab appointment scheduled 09/08/21 @ 10:30. Labs previously placed via PCP during last office visit. Patient desires to start low dose statin post lab test as needed and add CO-Q 10 if recommended.   I have personally reviewed and noted the following in the patient's chart:   Medical and social history Use of alcohol, tobacco or illicit drugs  Current medications and supplements including opioid prescriptions.  Functional ability and status Nutritional status Physical activity Advanced directives List of other physicians Hospitalizations, surgeries, and ER visits in previous 12 months Vitals Screenings to include cognitive, depression, and falls Referrals and appointments  In addition, I have reviewed and discussed with patient certain preventive protocols, quality metrics, and best practice recommendations. A written personalized care plan for preventive services as well as general preventive health recommendations were provided to patient.     OBrien-Blaney, Keyonta Madrid L, LPN   05/30/1094      I have reviewed the above information and agree with above.   Duncan Dull, MD

## 2021-09-02 ENCOUNTER — Other Ambulatory Visit: Payer: Self-pay | Admitting: Dermatology

## 2021-09-02 DIAGNOSIS — L649 Androgenic alopecia, unspecified: Secondary | ICD-10-CM

## 2021-09-08 ENCOUNTER — Other Ambulatory Visit (INDEPENDENT_AMBULATORY_CARE_PROVIDER_SITE_OTHER): Payer: Medicare Other

## 2021-09-08 DIAGNOSIS — I152 Hypertension secondary to endocrine disorders: Secondary | ICD-10-CM

## 2021-09-08 DIAGNOSIS — E1159 Type 2 diabetes mellitus with other circulatory complications: Secondary | ICD-10-CM

## 2021-09-08 DIAGNOSIS — E1169 Type 2 diabetes mellitus with other specified complication: Secondary | ICD-10-CM

## 2021-09-08 DIAGNOSIS — E669 Obesity, unspecified: Secondary | ICD-10-CM

## 2021-09-08 LAB — HEMOGLOBIN A1C: Hgb A1c MFr Bld: 6.6 % — ABNORMAL HIGH (ref 4.6–6.5)

## 2021-09-08 LAB — LIPID PANEL
Cholesterol: 282 mg/dL — ABNORMAL HIGH (ref 0–200)
HDL: 77.3 mg/dL (ref >39.00)
LDL Cholesterol: 181 mg/dL — ABNORMAL HIGH (ref 0–99)
NonHDL: 204.78
Total CHOL/HDL Ratio: 4
Triglycerides: 119 mg/dL (ref 0.0–149.0)
VLDL: 23.8 mg/dL (ref 0.0–40.0)

## 2021-09-08 LAB — COMPREHENSIVE METABOLIC PANEL
ALT: 28 U/L (ref 0–35)
AST: 20 U/L (ref 0–37)
Albumin: 4 g/dL (ref 3.5–5.2)
Alkaline Phosphatase: 67 U/L (ref 39–117)
BUN: 16 mg/dL (ref 6–23)
CO2: 26 mEq/L (ref 19–32)
Calcium: 9.2 mg/dL (ref 8.4–10.5)
Chloride: 103 mEq/L (ref 96–112)
Creatinine, Ser: 0.82 mg/dL (ref 0.40–1.20)
GFR: 73.41 mL/min (ref >60.00)
Glucose, Bld: 109 mg/dL — ABNORMAL HIGH (ref 70–99)
Potassium: 4.5 mEq/L (ref 3.5–5.1)
Sodium: 136 mEq/L (ref 135–145)
Total Bilirubin: 0.5 mg/dL (ref 0.2–1.2)
Total Protein: 6.9 g/dL (ref 6.0–8.3)

## 2021-09-10 ENCOUNTER — Encounter: Payer: Self-pay | Admitting: Internal Medicine

## 2021-09-12 ENCOUNTER — Other Ambulatory Visit: Payer: Self-pay | Admitting: Internal Medicine

## 2021-09-12 DIAGNOSIS — E782 Mixed hyperlipidemia: Secondary | ICD-10-CM

## 2021-09-12 MED ORDER — ATORVASTATIN CALCIUM 40 MG PO TABS: 40.0000 mg | ORAL_TABLET | ORAL | 0 refills | Status: DC

## 2021-09-12 NOTE — Assessment & Plan Note (Signed)
Advised to resume statin.  She will resume atorvastatin 40 mg every other day.  LFTs ordered ? ?Lab Results  ?Component Value Date  ? CHOL 282 (H) 09/08/2021  ? HDL 77.30 09/08/2021  ? LDLCALC 181 (H) 09/08/2021  ? LDLDIRECT 194.0 04/28/2021  ? TRIG 119.0 09/08/2021  ? CHOLHDL 4 09/08/2021  ? ? ?

## 2021-09-17 DIAGNOSIS — H2513 Age-related nuclear cataract, bilateral: Secondary | ICD-10-CM | POA: Diagnosis not present

## 2021-09-17 DIAGNOSIS — H40013 Open angle with borderline findings, low risk, bilateral: Secondary | ICD-10-CM | POA: Diagnosis not present

## 2021-09-23 DIAGNOSIS — H01005 Unspecified blepharitis left lower eyelid: Secondary | ICD-10-CM | POA: Diagnosis not present

## 2021-09-23 DIAGNOSIS — H25813 Combined forms of age-related cataract, bilateral: Secondary | ICD-10-CM | POA: Diagnosis not present

## 2021-09-23 DIAGNOSIS — H01002 Unspecified blepharitis right lower eyelid: Secondary | ICD-10-CM | POA: Diagnosis not present

## 2021-10-30 ENCOUNTER — Other Ambulatory Visit (INDEPENDENT_AMBULATORY_CARE_PROVIDER_SITE_OTHER): Payer: Medicare Other

## 2021-10-30 ENCOUNTER — Other Ambulatory Visit: Payer: Self-pay | Admitting: Lab

## 2021-10-30 DIAGNOSIS — E782 Mixed hyperlipidemia: Secondary | ICD-10-CM | POA: Diagnosis not present

## 2021-10-30 LAB — COMPREHENSIVE METABOLIC PANEL
ALT: 34 U/L (ref 0–35)
AST: 24 U/L (ref 0–37)
Albumin: 4 g/dL (ref 3.5–5.2)
Alkaline Phosphatase: 66 U/L (ref 39–117)
BUN: 13 mg/dL (ref 6–23)
CO2: 25 mEq/L (ref 19–32)
Calcium: 9.6 mg/dL (ref 8.4–10.5)
Chloride: 105 mEq/L (ref 96–112)
Creatinine, Ser: 0.71 mg/dL (ref 0.40–1.20)
GFR: 87.18 mL/min (ref >60.00)
Glucose, Bld: 123 mg/dL — ABNORMAL HIGH (ref 70–99)
Potassium: 4.3 mEq/L (ref 3.5–5.1)
Sodium: 138 mEq/L (ref 135–145)
Total Bilirubin: 0.4 mg/dL (ref 0.2–1.2)
Total Protein: 7.1 g/dL (ref 6.0–8.3)

## 2021-10-30 NOTE — Addendum Note (Signed)
Addended by: Leeanne Rio on: 10/30/2021 09:34 AM   Modules accepted: Orders

## 2021-11-03 DIAGNOSIS — H25811 Combined forms of age-related cataract, right eye: Secondary | ICD-10-CM | POA: Diagnosis not present

## 2021-11-03 DIAGNOSIS — H269 Unspecified cataract: Secondary | ICD-10-CM | POA: Diagnosis not present

## 2021-11-03 DIAGNOSIS — H52221 Regular astigmatism, right eye: Secondary | ICD-10-CM | POA: Diagnosis not present

## 2021-11-03 HISTORY — PX: OTHER SURGICAL HISTORY: SHX169

## 2021-11-05 DIAGNOSIS — H25812 Combined forms of age-related cataract, left eye: Secondary | ICD-10-CM | POA: Diagnosis not present

## 2021-11-09 ENCOUNTER — Other Ambulatory Visit: Payer: Self-pay | Admitting: Dermatology

## 2021-11-09 DIAGNOSIS — L649 Androgenic alopecia, unspecified: Secondary | ICD-10-CM

## 2021-11-09 IMAGING — MG MM DIGITAL SCREENING BILAT W/ TOMO AND CAD
8 series · 8 of 24 positions shown · non-contrast
Comparison: Previous exam(s).

CLINICAL DATA: Screening.

EXAM:
DIGITAL SCREENING BILATERAL MAMMOGRAM WITH TOMOSYNTHESIS AND CAD
TECHNIQUE: Bilateral screening digital craniocaudal and mediolateral oblique
mammograms were obtained. Bilateral screening digital breast
tomosynthesis was performed. The images were evaluated with
computer-aided detection.

[L MLO synth-2D]
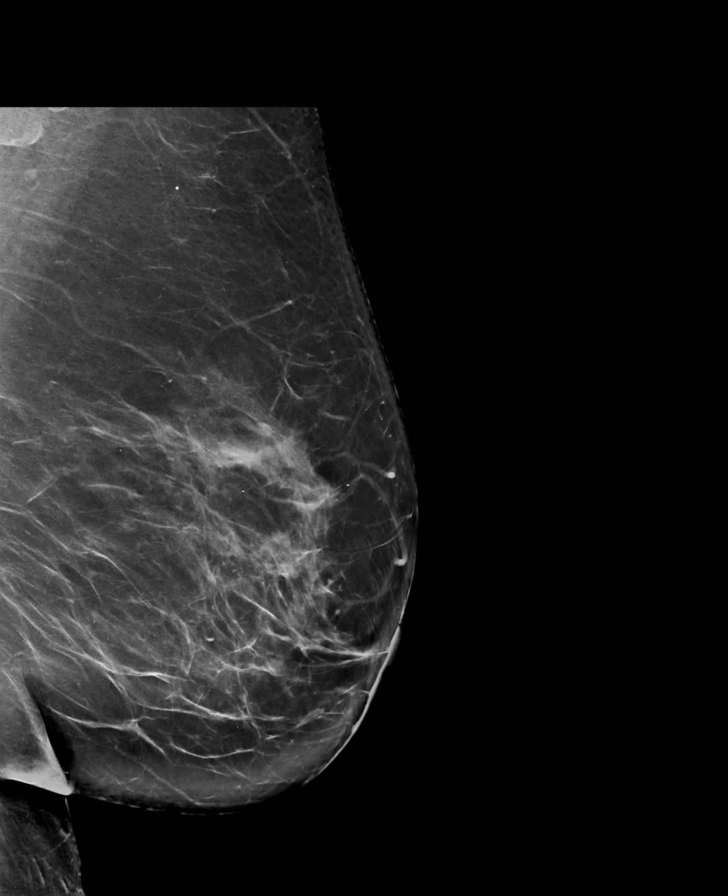

[R MLO synth-2D]
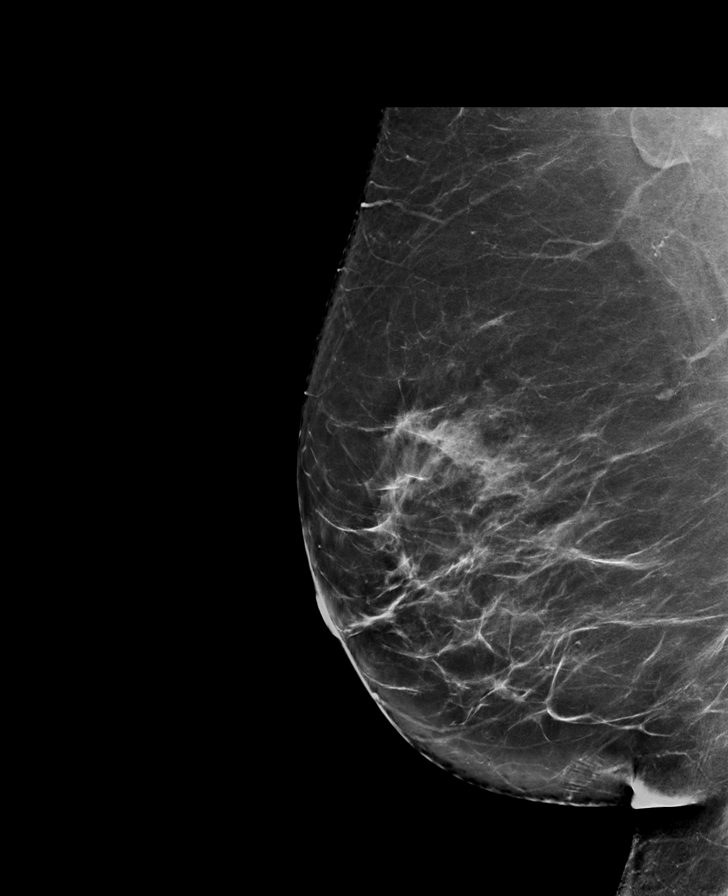

[L CC synth-2D]
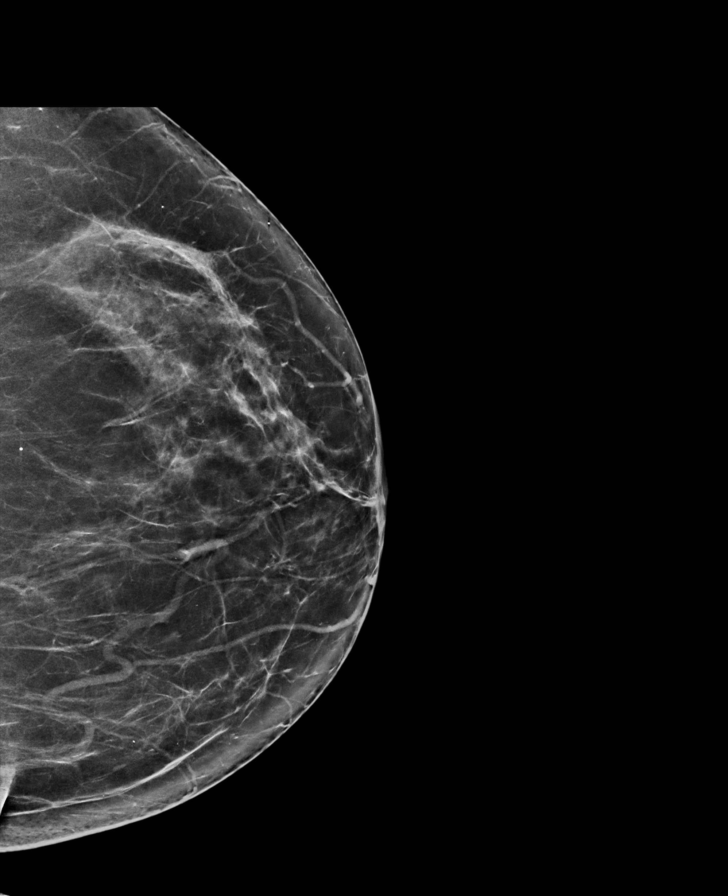

[R CC synth-2D]
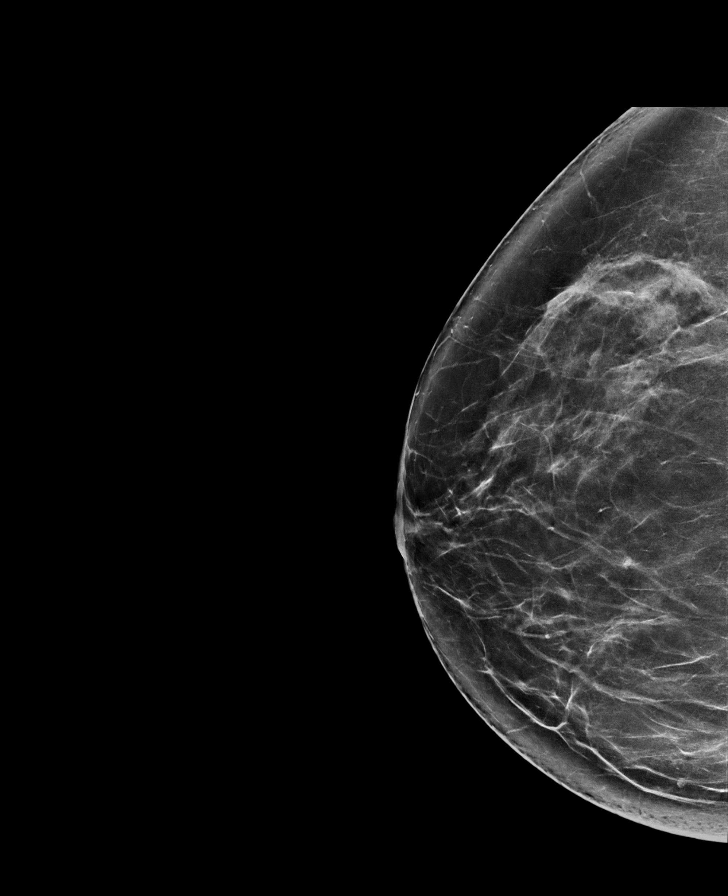

[R CC tomo · tomo slice 43/85.0]
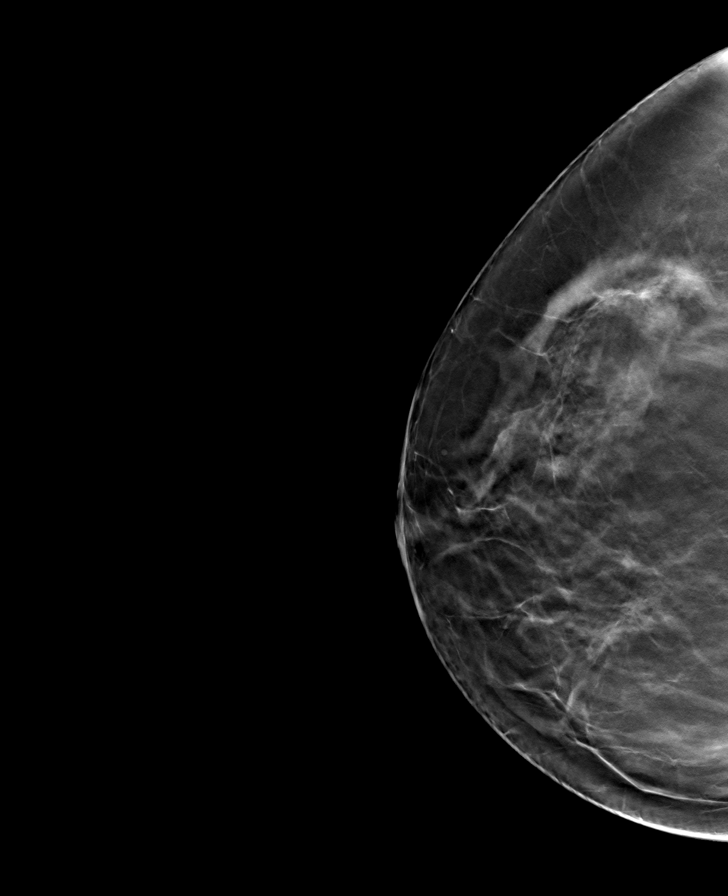

[L MLO tomo · tomo slice 51/100.0]
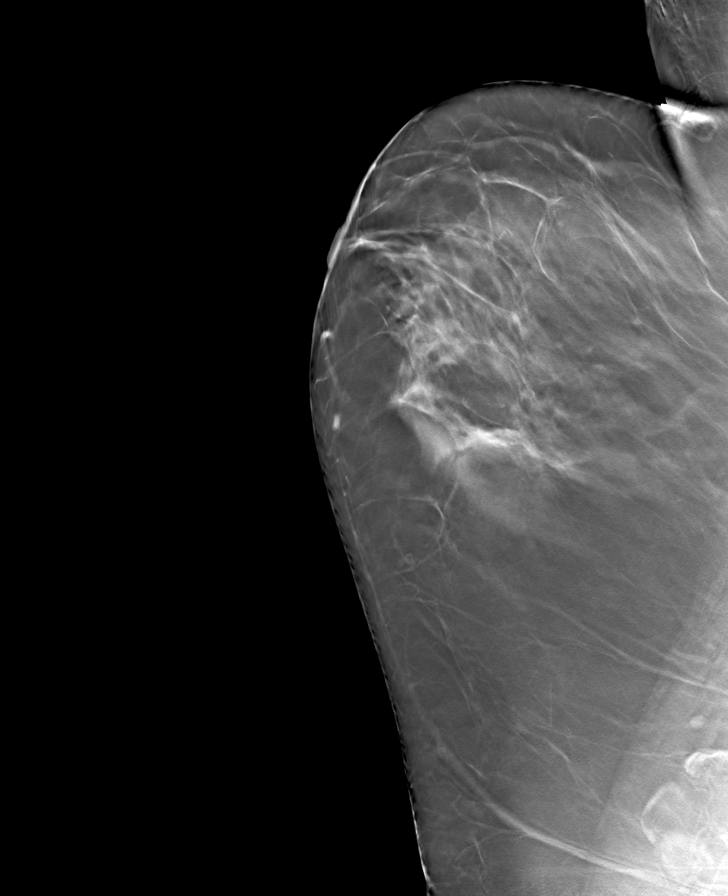

[R MLO tomo · tomo slice 48/95.0]
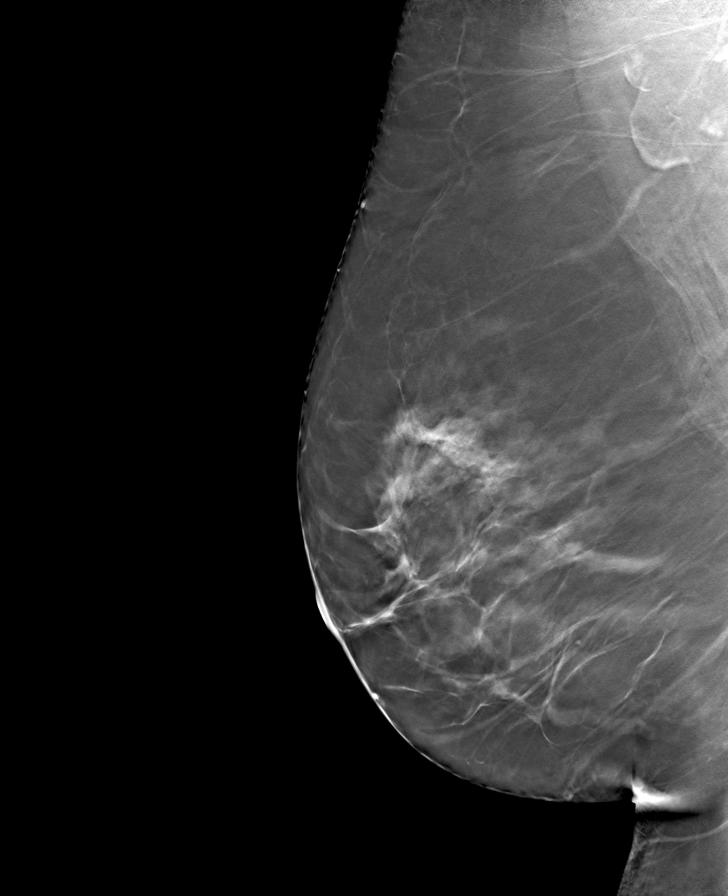

[L CC tomo · tomo slice 39/77.0]
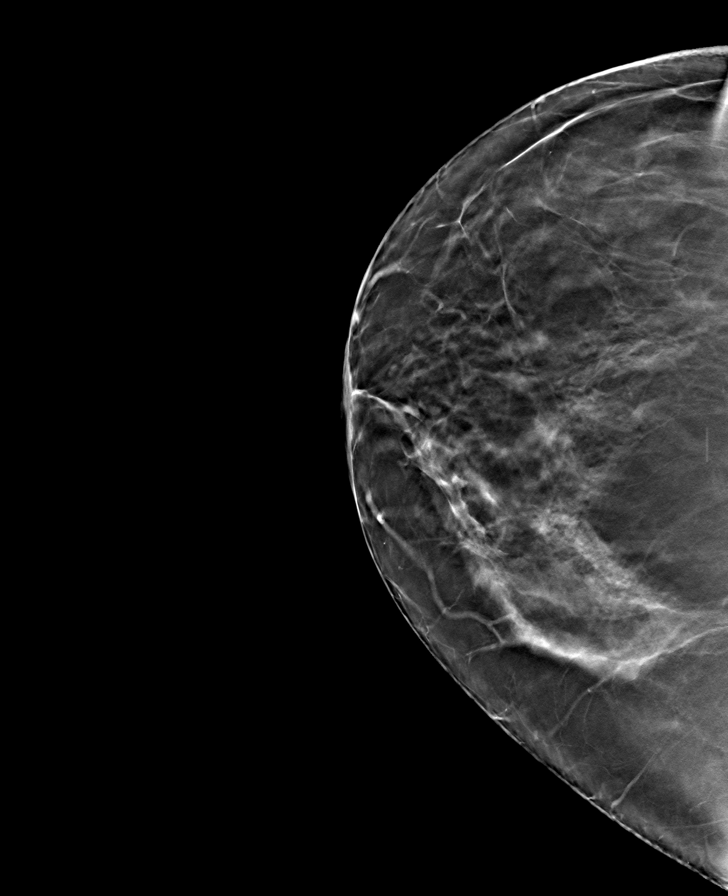

[8 of 24 positions shown; findings below may reference images not displayed]

ACR Breast Density Category b: There are scattered areas of
fibroglandular density.
FINDINGS: There are no findings suspicious for malignancy.
IMPRESSION: No mammographic evidence of malignancy. A result letter of this
screening mammogram will be mailed directly to the patient.

RECOMMENDATION:
Screening mammogram in one year. (Code:51-O-LD2)

BI-RADS CATEGORY  1: Negative.

## 2021-11-10 DIAGNOSIS — H25812 Combined forms of age-related cataract, left eye: Secondary | ICD-10-CM | POA: Diagnosis not present

## 2021-11-10 DIAGNOSIS — H52222 Regular astigmatism, left eye: Secondary | ICD-10-CM | POA: Diagnosis not present

## 2021-11-10 DIAGNOSIS — H269 Unspecified cataract: Secondary | ICD-10-CM | POA: Diagnosis not present

## 2021-11-24 ENCOUNTER — Other Ambulatory Visit: Payer: Self-pay | Admitting: Internal Medicine

## 2021-11-29 ENCOUNTER — Encounter: Payer: Self-pay | Admitting: Internal Medicine

## 2021-12-07 ENCOUNTER — Other Ambulatory Visit: Payer: Self-pay | Admitting: Dermatology

## 2021-12-07 DIAGNOSIS — L649 Androgenic alopecia, unspecified: Secondary | ICD-10-CM

## 2022-01-02 ENCOUNTER — Other Ambulatory Visit: Payer: Self-pay | Admitting: Dermatology

## 2022-01-02 DIAGNOSIS — L649 Androgenic alopecia, unspecified: Secondary | ICD-10-CM

## 2022-01-19 ENCOUNTER — Ambulatory Visit (INDEPENDENT_AMBULATORY_CARE_PROVIDER_SITE_OTHER): Payer: Medicare Other | Admitting: Dermatology

## 2022-01-19 DIAGNOSIS — L821 Other seborrheic keratosis: Secondary | ICD-10-CM | POA: Diagnosis not present

## 2022-01-19 DIAGNOSIS — L817 Pigmented purpuric dermatosis: Secondary | ICD-10-CM

## 2022-01-19 DIAGNOSIS — D225 Melanocytic nevi of trunk: Secondary | ICD-10-CM

## 2022-01-19 DIAGNOSIS — L578 Other skin changes due to chronic exposure to nonionizing radiation: Secondary | ICD-10-CM

## 2022-01-19 DIAGNOSIS — L649 Androgenic alopecia, unspecified: Secondary | ICD-10-CM

## 2022-01-19 DIAGNOSIS — Z86018 Personal history of other benign neoplasm: Secondary | ICD-10-CM

## 2022-01-19 DIAGNOSIS — L900 Lichen sclerosus et atrophicus: Secondary | ICD-10-CM

## 2022-01-19 DIAGNOSIS — D229 Melanocytic nevi, unspecified: Secondary | ICD-10-CM

## 2022-01-19 NOTE — Progress Notes (Signed)
Follow-Up Visit   Subjective  Sheri Arnold is a 71 y.o. female who presents for the following: LS&A (Inframammary/upper abdomen, Betamethasone valerate d/c, Zoryve did not help, has also tried in past Protopic, Vtama, Opzelura), Alopecia (Scalp, Loniten 2.'5mg'$  1/2 po qd pt feels like stab), and Total body skin exam (Hx of Dysplastic nevus mid abdomen, hx of AKs).   The following portions of the chart were reviewed this encounter and updated as appropriate:       Review of Systems:  No other skin or systemic complaints except as noted in HPI or Assessment and Plan.  Objective  Well appearing patient in no apparent distress; mood and affect are within normal limits.  A full examination was performed including scalp, head, eyes, ears, nose, lips, neck, chest, axillae, abdomen, back, buttocks, bilateral upper extremities, bilateral lower extremities, hands, feet, fingers, toes, fingernails, and toenails. All findings within normal limits unless otherwise noted below.  Scalp Mild thinning hair of crown.  Photo compared.  Stable to slightly improved  abdomen, inframammary Hypopigmented atrophic macules upper abdomen, mid abdomen, inframammary  L spinal lower back, mid sternum, R medial breast, R medial buttock L spinal lower back 4.65m medium brown macule  Mid sternum 1.0cm fleshy brown pap  R medial breast 9.0 x 5.066mpink flesh flat papule  L medial buttock 2.66m73med dark brown macule  crown scalp 1.8 x 1.4cm waxy tan speckled patch, photo from 06/30/21 compared- stable  bil lower legs Cayenne pepper macules bil pretibia    Assessment & Plan   Seborrheic Keratoses - Stuck-on, waxy, tan-brown papules and/or plaques  - Benign-appearing - Discussed benign etiology and prognosis. - Observe - Call for any changes - face, back  Melanocytic Nevi - Tan-brown and/or pink-flesh-colored symmetric macules and papules - Benign appearing on exam today - Observation - Call  clinic for new or changing moles - Recommend daily use of broad spectrum spf 30+ sunscreen to sun-exposed areas.  - trunk  Actinic Damage - Chronic condition, secondary to cumulative UV/sun exposure - diffuse scaly erythematous macules with underlying dyspigmentation - Recommend daily broad spectrum sunscreen SPF 30+ to sun-exposed areas, reapply every 2 hours as needed.  - Staying in the shade or wearing long sleeves, sun glasses (UVA+UVB protection) and wide brim hats (4-inch brim around the entire circumference of the hat) are also recommended for sun protection.  - Call for new or changing lesions.  Skin cancer screening performed today.   History of Dysplastic Nevi - No evidence of recurrence today - Recommend regular full body skin exams - Recommend daily broad spectrum sunscreen SPF 30+ to sun-exposed areas, reapply every 2 hours as needed.  - Call if any new or changing lesions are noted between office visits  - mid abdomen  Androgenic alopecia Scalp  Chronic and persistent condition with duration or expected duration over one year. Condition is symptomatic/ bothersome to patient. Improving but not currently at goal. Discussed increasing dose of minoxidil, but pt defers due to h/o low blood pressure.  Female Androgenic Alopecia is a chronic condition related to genetics and/or hormonal changes.  In women androgenetic alopecia is commonly associated with menopause but may occur any time after puberty.  It causes hair thinning primarily on the crown with widening of the part and temporal hairline recession.  Can use OTC Rogaine (minoxidil) 5% solution/foam as directed.  Oral treatments in female patients who have no contraindication may include : - Low dose oral minoxidil 1.25 - '5mg'$   daily - Spironolactone 50 - '100mg'$  bid - Finasteride 2.5 - 5 mg daily Adjunctive therapies include: - Low Level Laser Light Therapy (LLLT) - Platelet-rich plasma injections (PRP) - Hair Transplants  or scalp reduction   BP = 115/65 Pulse 97  Cont Loniten 2.'5mg'$  1/2 po qd  Doses of minoxidil for hair loss are considered 'low dose'. This is because the doses used for hair loss are a lot lower than the doses which are used for conditions such as high blood pressure (hypertension). The doses used for hypertension are 10-'40mg'$  per day.  Side effects are uncommon at the low doses (up to 2.5 mg/day) used to treat hair loss. Potential side effects, more commonly seen at higher doses, include: Increase in hair growth (hypertrichosis) elsewhere on face and body Temporary hair shedding upon starting medication which may last up to 4 weeks Ankle swelling, fluid retention, rapid weight gain more than 5 pounds Low blood pressure and feeling lightheaded or dizzy when standing up quickly Fast or irregular heartbeat Headaches   Related Medications minoxidil (LONITEN) 2.5 MG tablet TAKE 1 TABLET BY MOUTH EVERY DAY  Lichen sclerosus et atrophicus abdomen, inframammary  Extragenital LS&A, Chronic and persistent condition with duration or expected duration over one year.  No improvement but stable.  Has tried and failed Tacrolimus, Zoryve, Opzelura, Vtama. Not bothersome to patient, so prefers just to treat as needed with topical steroid.  Lichen sclerosus is a chronic inflammatory condition of unknown cause that frequently involves the vaginal area and less commonly extragenital skin, and is NOT sexually transmitted. It frequently causes symptoms of pain and burning.  It requires regular monitoring and treatment with topical steroids to minimize inflammation and to reduce risk of scarring. There is also a risk of cancer in the vaginal area which is very low if inflammation is well controlled. Regular checks of the area are recommended. Please call if you notice any new or changing spots within this area.  Restart Betamethasone valerate cr qd up to 5d/wk prn flares, avoid f/g/a  Nevus L spinal lower back,  mid sternum, R medial breast, R medial buttock  (Vs LS&A R medial breast)  Stable, Benign-appearing.  Observation.  Call clinic for new or changing moles.  Recommend daily use of broad spectrum spf 30+ sunscreen to sun-exposed areas.    Seborrheic keratosis crown scalp  Stable Benign-appearing.  Observation.  Call clinic for new or changing lesions.  Recommend daily use of broad spectrum spf 30+ sunscreen to sun-exposed areas.    Schamberg's purpura bil lower legs  Benign, observe.    Start Tacrolimus 0.1% oint qhs to lower legs Start compression socks qAM   Return in about 1 year (around 01/20/2023) for TBSE, Hx of Dysplastic nevi, Hx of AKs.  I, Othelia Pulling, RMA, am acting as scribe for [name], MD .  Documentation: I have reviewed the above documentation for accuracy and completeness, and I agree with the above.  [name] MD

## 2022-01-19 NOTE — Patient Instructions (Addendum)
Schamberg's Purpura lower legs Start Tacrolimus 0.1% ointment nightly to lower legs Start compression socks daily     Due to recent changes in healthcare laws, you may see results of your pathology and/or laboratory studies on MyChart before the doctors have had a chance to review them. We understand that in some cases there may be results that are confusing or concerning to you. Please understand that not all results are received at the same time and often the doctors may need to interpret multiple results in order to provide you with the best plan of care or course of treatment. Therefore, we ask that you please give [place] 2 business days to thoroughly review all your results before contacting the office for clarification. Should we see a critical lab result, you will be contacted sooner.   If You Need Anything After Your Visit  If you have any questions or concerns for your doctor, please call our main line at [phone] and press option 4 to reach your doctor's medical assistant. If no one answers, please leave a voicemail as directed and we will return your call as soon as possible. Messages left after 4 pm will be answered the following business day.   You may also send [place] a message via [place]. We typically respond to MyChart messages within 1-2 business days.  For prescription refills, please ask your pharmacy to contact our office. Our fax number is [phone].  If you have an urgent issue when the clinic is closed that cannot wait until the next business day, you can page your doctor at the number below.    Please note that while we do our best to be available for urgent issues outside of office hours, we are not available 24/7.   If you have an urgent issue and are unable to reach [place], you may choose to seek medical care at your doctor's office, retail clinic, urgent care center, or emergency room.  If you have a medical emergency, please immediately call 911 or go to the emergency  department.  Pager Numbers  - Dr. Nehemiah Massed: [phone]  - Dr. Laurence Ferrari: [phone]  - Dr. Nicole Kindred: [phone]  In the event of inclement weather, please call our main line at [phone] for an update on the status of any delays or closures.  Dermatology Medication Tips: Please keep the boxes that topical medications come in in order to help keep track of the instructions about where and how to use these. Pharmacies typically print the medication instructions only on the boxes and not directly on the medication tubes.   If your medication is too expensive, please contact our office at [phone] option 4 or send [place] a message through [place].   We are unable to tell what your co-pay for medications will be in advance as this is different depending on your insurance coverage. However, we may be able to find a substitute medication at lower cost or fill out paperwork to get insurance to cover a needed medication.   If a prior authorization is required to get your medication covered by your insurance company, please allow [place] 1-2 business days to complete this process.  Drug prices often vary depending on where the prescription is filled and some pharmacies may offer cheaper prices.  The website www.goodrx.com contains coupons for medications through different pharmacies. The prices here do not account for what the cost may be with help from insurance (it may be cheaper with your insurance), but the website can give you the  price if you did not use any insurance.  - You can print the associated coupon and take it with your prescription to the pharmacy.  - You may also stop by our office during regular business hours and pick up a GoodRx coupon card.  - If you need your prescription sent electronically to a different pharmacy, notify our office through  MyChart or by phone at [phone] option 4.     Si Usted Necesita Algo Despus de Su Visita  Tambin puede enviarnos un  mensaje a travs de MyChart. Por lo general respondemos a los mensajes de MyChart en el transcurso de 1 a 2 das hbiles.  Para renovar recetas, por favor pida a su farmacia que se ponga en contacto con nuestra oficina. Nuestro nmero de fax es el [phone].  Si tiene un asunto urgente cuando la clnica est cerrada y que no puede esperar hasta el siguiente da hbil, puede llamar/localizar a su doctor(a) al nmero que aparece a continuacin.   Por favor, tenga en cuenta que aunque hacemos todo lo posible para estar disponibles para asuntos urgentes fuera del horario de oficina, no estamos disponibles las 24 horas del da, los 7 das de la semana.   Si tiene un problema urgente y no puede comunicarse con nosotros, puede optar por buscar atencin mdica  en el consultorio de su doctor(a), en una clnica privada, en un centro de atencin urgente o en una sala de emergencias.  Si tiene una emergencia mdica, por favor llame inmediatamente al 911 o vaya a la sala de emergencias.  Nmeros de bper  - Dr. Kowalski: [phone]  - Dra. Moye: [phone]  - Dra. Stewart: [phone]  En caso de inclemencias del tiempo, por favor llame a nuestra lnea principal al [phone] para una actualizacin sobre el estado de cualquier retraso o cierre.  Consejos para la medicacin en dermatologa: Por favor, guarde las cajas en las que vienen los medicamentos de uso tpico para ayudarle a seguir las instrucciones sobre dnde y cmo usarlos. Las farmacias generalmente imprimen las instrucciones del medicamento slo en las cajas y no directamente en los tubos del medicamento.   Si su medicamento es muy caro, por favor, pngase en contacto con nuestra oficina llamando al [phone] y presione la opcin 4 o envenos un mensaje a travs de MyChart.   No podemos decirle cul ser su copago por los medicamentos por adelantado ya que esto es diferente dependiendo de la cobertura de su seguro. Sin embargo,  es posible que podamos encontrar un medicamento sustituto a menor costo o llenar un formulario para que el seguro cubra el medicamento que se considera necesario.   Si se requiere una autorizacin previa para que su compaa de seguros cubra su medicamento, por favor permtanos de 1 a 2 das hbiles para completar este proceso.  Los precios de los medicamentos varan con frecuencia dependiendo del lugar de dnde se surte la receta y alguna farmacias pueden ofrecer precios ms baratos.  El sitio web www.goodrx.com tiene cupones para medicamentos de diferentes farmacias. Los precios aqu no tienen en cuenta lo que podra costar con la ayuda del seguro (puede ser ms barato con su seguro), pero el sitio web puede darle el precio si no utiliz ningn seguro.  - Puede imprimir el cupn correspondiente y llevarlo con su receta a la farmacia.  - Tambin puede pasar por nuestra oficina durante el horario de atencin regular y recoger una tarjeta de cupones de GoodRx.  - Si necesita que   su receta se enve electrnicamente a una farmacia diferente, informe a nuestra oficina a travs de MyChart de [place] o por telfono llamando al [phone] y presione la opcin 4.  

## 2022-02-11 ENCOUNTER — Encounter: Payer: Self-pay | Admitting: Internal Medicine

## 2022-02-11 DIAGNOSIS — E1169 Type 2 diabetes mellitus with other specified complication: Secondary | ICD-10-CM

## 2022-02-11 DIAGNOSIS — T466X5A Adverse effect of antihyperlipidemic and antiarteriosclerotic drugs, initial encounter: Secondary | ICD-10-CM | POA: Insufficient documentation

## 2022-02-27 ENCOUNTER — Inpatient Hospital Stay: Admission: RE | Admit: 2022-02-27 | Payer: Medicare Other | Source: Ambulatory Visit

## 2022-03-16 ENCOUNTER — Other Ambulatory Visit: Payer: Self-pay | Admitting: Internal Medicine

## 2022-03-23 ENCOUNTER — Ambulatory Visit
Admission: RE | Admit: 2022-03-23 | Discharge: 2022-03-23 | Disposition: A | Discharge status: Home / Self Care | Payer: Medicare Other | Source: Ambulatory Visit | Attending: Internal Medicine | Admitting: Internal Medicine

## 2022-03-23 DIAGNOSIS — M791 Myalgia, unspecified site: Secondary | ICD-10-CM | POA: Insufficient documentation

## 2022-03-23 DIAGNOSIS — E785 Hyperlipidemia, unspecified: Secondary | ICD-10-CM | POA: Insufficient documentation

## 2022-03-23 DIAGNOSIS — E1169 Type 2 diabetes mellitus with other specified complication: Secondary | ICD-10-CM | POA: Insufficient documentation

## 2022-03-23 DIAGNOSIS — T466X5A Adverse effect of antihyperlipidemic and antiarteriosclerotic drugs, initial encounter: Secondary | ICD-10-CM | POA: Insufficient documentation

## 2022-04-03 DIAGNOSIS — H26493 Other secondary cataract, bilateral: Secondary | ICD-10-CM | POA: Diagnosis not present

## 2022-04-03 DIAGNOSIS — H40013 Open angle with borderline findings, low risk, bilateral: Secondary | ICD-10-CM | POA: Diagnosis not present

## 2022-04-28 ENCOUNTER — Ambulatory Visit (INDEPENDENT_AMBULATORY_CARE_PROVIDER_SITE_OTHER): Payer: Medicare Other | Admitting: Dermatology

## 2022-04-28 VITALS — BP 120/64 | HR 70

## 2022-04-28 DIAGNOSIS — L900 Lichen sclerosus et atrophicus: Secondary | ICD-10-CM

## 2022-04-28 DIAGNOSIS — D485 Neoplasm of uncertain behavior of skin: Secondary | ICD-10-CM

## 2022-04-28 DIAGNOSIS — L820 Inflamed seborrheic keratosis: Secondary | ICD-10-CM

## 2022-04-28 NOTE — Progress Notes (Signed)
   Follow-Up Visit   Subjective  Sheri Arnold is a 71 y.o. female who presents for the following: Spot (Right breast. Patient noticed 2 weeks ago and has changed color. ). Pt has h/o LS&A on body that she doesn't treat, since not bothersome.   The following portions of the chart were reviewed this encounter and updated as appropriate:       Review of Systems:  No other skin or systemic complaints except as noted in HPI or Assessment and Plan.  Objective  Well appearing patient in no apparent distress; mood and affect are within normal limits.  A focused examination was performed including left breast. Relevant physical exam findings are noted in the Assessment and Plan.  Right Medial Breast 8.0 x 5.0 mm bright red slightly waxy papule       Right Breast Hypopigmented atrophic macules upper abdomen, mid abdomen, inframammary     Assessment & Plan  Neoplasm of uncertain behavior of skin Right Medial Breast  Epidermal / dermal shaving  Lesion diameter (cm):  0.8 Informed consent: discussed and consent obtained   Patient was prepped and draped in usual sterile fashion: Area prepped with alcohol. Anesthesia: the lesion was anesthetized in a standard fashion   Anesthetic:  1% lidocaine w/ epinephrine 1-100,000 buffered w/ 8.4% NaHCO3 Instrument used: flexible razor blade   Hemostasis achieved with: pressure, aluminum chloride and electrodesiccation   Outcome: patient tolerated procedure well   Post-procedure details: wound care instructions given   Post-procedure details comment:  Ointment and small bandage applied  Specimen 1 - Surgical pathology Differential Diagnosis: ISK vs Irritated Nevus vs LS&A vs other Pt with hx of Extragenital LS&A Check Margins: No  Lichen sclerosus et atrophicus Right Breast  Extragenital LS&A, Chronic and persistent condition with duration or expected duration over one year.  No improvement but stable.  Has tried and failed Tacrolimus,  Zoryve, Opzelura, Vtama. Not bothersome to patient, so prefers just to treat as needed with topical steroid.    Lichen sclerosus is a chronic inflammatory condition of unknown cause that frequently involves the vaginal area and less commonly extragenital skin, and is NOT sexually transmitted. It frequently causes symptoms of pain and burning.  It requires regular monitoring and treatment with topical steroids to minimize inflammation and to reduce risk of scarring. There is also a risk of cancer in the vaginal area which is very low if inflammation is well controlled. Regular checks of the area are recommended. Please call if you notice any new or changing spots within this area.   Return as scheduled, for TBSE.  IJamesetta Arnold, CMA, am acting as scribe for Sheri Patty, MD .  Documentation: I have reviewed the above documentation for accuracy and completeness, and I agree with the above.  Sheri Patty MD

## 2022-04-28 NOTE — Patient Instructions (Addendum)
Wound Care Instructions  Cleanse wound gently with soap and water once a day then pat dry with clean gauze. Apply a thin coat of Petrolatum (petroleum jelly, "Vaseline") over the wound (unless you have an allergy to this). We recommend that you use a new, sterile tube of Vaseline. Do not pick or remove scabs. Do not remove the yellow or white "healing tissue" from the base of the wound.  Cover the wound with fresh, clean, nonstick gauze and secure with paper tape. You may use Band-Aids in place of gauze and tape if the wound is small enough, but would recommend trimming much of the tape off as there is often too much. Sometimes Band-Aids can irritate the skin.  You should call the office for your biopsy report after 1 week if you have not already been contacted.  If you experience any problems, such as abnormal amounts of bleeding, swelling, significant bruising, significant pain, or evidence of infection, please call the office immediately.  FOR ADULT SURGERY PATIENTS: If you need something for pain relief you may take 1 extra strength Tylenol (acetaminophen) AND 2 Ibuprofen (200mg each) together every 4 hours as needed for pain. (do not take these if you are allergic to them or if you have a reason you should not take them.) Typically, you may only need pain medication for 1 to 3 days.     Due to recent changes in healthcare laws, you may see results of your pathology and/or laboratory studies on MyChart before the doctors have had a chance to review them. We understand that in some cases there may be results that are confusing or concerning to you. Please understand that not all results are received at the same time and often the doctors may need to interpret multiple results in order to provide you with the best plan of care or course of treatment. Therefore, we ask that you please give us 2 business days to thoroughly review all your results before contacting the office for clarification. Should  we see a critical lab result, you will be contacted sooner.   If You Need Anything After Your Visit  If you have any questions or concerns for your doctor, please call our main line at [phone] and press option 4 to reach your doctor's medical assistant. If no one answers, please leave a voicemail as directed and we will return your call as soon as possible. Messages left after 4 pm will be answered the following business day.   You may also send us a message via MyChart. We typically respond to MyChart messages within 1-2 business days.  For prescription refills, please ask your pharmacy to contact our office. Our fax number is [phone].  If you have an urgent issue when the clinic is closed that cannot wait until the next business day, you can page your doctor at the number below.    Please note that while we do our best to be available for urgent issues outside of office hours, we are not available 24/7.   If you have an urgent issue and are unable to reach us, you may choose to seek medical care at your doctor's office, retail clinic, urgent care center, or emergency room.  If you have a medical emergency, please immediately call 911 or go to the emergency department.  Pager Numbers  - Dr. Kowalski: [phone]  - Dr. Moye: [phone]  - Dr. Stewart: [phone]  In the event of inclement weather, please call our main line at   [phone] for an update on the status of any delays or closures.  Dermatology Medication Tips: Please keep the boxes that topical medications come in in order to help keep track of the instructions about where and how to use these. Pharmacies typically print the medication instructions only on the boxes and not directly on the medication tubes.   If your medication is too expensive, please contact our office at [phone] option 4 or send us a message through MyChart.   We are unable to tell what your co-pay for medications will be in  advance as this is different depending on your insurance coverage. However, we may be able to find a substitute medication at lower cost or fill out paperwork to get insurance to cover a needed medication.   If a prior authorization is required to get your medication covered by your insurance company, please allow us 1-2 business days to complete this process.  Drug prices often vary depending on where the prescription is filled and some pharmacies may offer cheaper prices.  The website www.goodrx.com contains coupons for medications through different pharmacies. The prices here do not account for what the cost may be with help from insurance (it may be cheaper with your insurance), but the website can give you the price if you did not use any insurance.  - You can print the associated coupon and take it with your prescription to the pharmacy.  - You may also stop by our office during regular business hours and pick up a GoodRx coupon card.  - If you need your prescription sent electronically to a different pharmacy, notify our office through [place] MyChart or by phone at [phone] option 4.     Si Usted Necesita Algo Despus de Su Visita  Tambin puede enviarnos un mensaje a travs de MyChart. Por lo general respondemos a los mensajes de MyChart en el transcurso de 1 a 2 das hbiles.  Para renovar recetas, por favor pida a su farmacia que se ponga en contacto con nuestra oficina. Nuestro nmero de fax es el [phone].  Si tiene un asunto urgente cuando la clnica est cerrada y que no puede esperar hasta el siguiente da hbil, puede llamar/localizar a su doctor(a) al nmero que aparece a continuacin.   Por favor, tenga en cuenta que aunque hacemos todo lo posible para estar disponibles para asuntos urgentes fuera del horario de oficina, no estamos disponibles las 24 horas del da, los 7 das de la semana.   Si tiene un problema urgente y no puede comunicarse con nosotros, puede  optar por buscar atencin mdica  en el consultorio de su doctor(a), en una clnica privada, en un centro de atencin urgente o en una sala de emergencias.  Si tiene una emergencia mdica, por favor llame inmediatamente al 911 o vaya a la sala de emergencias.  Nmeros de bper  - Dr. Kowalski: [phone]  - Dra. Moye: [phone]  - Dra. Stewart: [phone]  En caso de inclemencias del tiempo, por favor llame a nuestra lnea principal al [phone] para una actualizacin sobre el estado de cualquier retraso o cierre.  Consejos para la medicacin en dermatologa: Por favor, guarde las cajas en las que vienen los medicamentos de uso tpico para ayudarle a seguir las instrucciones sobre dnde y cmo usarlos. Las farmacias generalmente imprimen las instrucciones del medicamento slo en las cajas y no directamente en los tubos del medicamento.   Si su medicamento es muy caro, por favor, pngase en contacto con   nuestra oficina llamando al [phone] y presione la opcin 4 o envenos un mensaje a travs de MyChart.   No podemos decirle cul ser su copago por los medicamentos por adelantado ya que esto es diferente dependiendo de la cobertura de su seguro. Sin embargo, es posible que podamos encontrar un medicamento sustituto a menor costo o llenar un formulario para que el seguro cubra el medicamento que se considera necesario.   Si se requiere una autorizacin previa para que su compaa de seguros cubra su medicamento, por favor permtanos de 1 a 2 das hbiles para completar este proceso.  Los precios de los medicamentos varan con frecuencia dependiendo del lugar de dnde se surte la receta y alguna farmacias pueden ofrecer precios ms baratos.  El sitio web www.goodrx.com tiene cupones para medicamentos de diferentes farmacias. Los precios aqu no tienen en cuenta lo que podra costar con la ayuda del seguro (puede ser ms barato con su seguro), pero el sitio web puede darle el  precio si no utiliz ningn seguro.  - Puede imprimir el cupn correspondiente y llevarlo con su receta a la farmacia.  - Tambin puede pasar por nuestra oficina durante el horario de atencin regular y recoger una tarjeta de cupones de GoodRx.  - Si necesita que su receta se enve electrnicamente a una farmacia diferente, informe a nuestra oficina a travs de MyChart de [place] o por telfono llamando al [phone] y presione la opcin 4.  

## 2022-05-06 ENCOUNTER — Telehealth: Payer: Self-pay

## 2022-05-06 NOTE — Telephone Encounter (Signed)
-----   Message from [name], MD sent at 05/06/2022 10:29 AM EST ----- Skin , right medial breast LICHEN SCLEROSUS, INFLAMED  Benign LS&A, can treat with topical antiinflammatory once healed if she wants, I know she has chosen not to treat the LS&A in the past since it isn't bothersome.  Can send in mometasone cream qd/bid to [facility] chest prn flares.  please call patient

## 2022-05-06 NOTE — Telephone Encounter (Signed)
Advised pt of bx results.  Advised pt we would send in Mometasone cream for her to use on the Lichen Sclerosus and she said she did not want to treat at this time b/c it was not bothersome to her.  Advised pt to call office if she changes her mind and wants [place] to send in the Mometasone cream./sh

## 2022-05-15 ENCOUNTER — Other Ambulatory Visit: Payer: Self-pay | Admitting: Internal Medicine

## 2022-06-12 ENCOUNTER — Ambulatory Visit (INDEPENDENT_AMBULATORY_CARE_PROVIDER_SITE_OTHER): Payer: Medicare Other | Admitting: Internal Medicine

## 2022-06-12 ENCOUNTER — Encounter: Payer: Self-pay | Admitting: Internal Medicine

## 2022-06-12 VITALS — BP 118/72 | HR 61 | Temp 98.2°F | Ht 65.0 in | Wt 211.0 lb

## 2022-06-12 DIAGNOSIS — T466X5A Adverse effect of antihyperlipidemic and antiarteriosclerotic drugs, initial encounter: Secondary | ICD-10-CM | POA: Diagnosis not present

## 2022-06-12 DIAGNOSIS — Z1231 Encounter for screening mammogram for malignant neoplasm of breast: Secondary | ICD-10-CM

## 2022-06-12 DIAGNOSIS — E669 Obesity, unspecified: Secondary | ICD-10-CM

## 2022-06-12 DIAGNOSIS — E785 Hyperlipidemia, unspecified: Secondary | ICD-10-CM

## 2022-06-12 DIAGNOSIS — E039 Hypothyroidism, unspecified: Secondary | ICD-10-CM | POA: Diagnosis not present

## 2022-06-12 DIAGNOSIS — E782 Mixed hyperlipidemia: Secondary | ICD-10-CM | POA: Diagnosis not present

## 2022-06-12 DIAGNOSIS — E1169 Type 2 diabetes mellitus with other specified complication: Secondary | ICD-10-CM

## 2022-06-12 DIAGNOSIS — Z1211 Encounter for screening for malignant neoplasm of colon: Secondary | ICD-10-CM

## 2022-06-12 DIAGNOSIS — E1159 Type 2 diabetes mellitus with other circulatory complications: Secondary | ICD-10-CM | POA: Diagnosis not present

## 2022-06-12 DIAGNOSIS — R5383 Other fatigue: Secondary | ICD-10-CM

## 2022-06-12 DIAGNOSIS — I152 Hypertension secondary to endocrine disorders: Secondary | ICD-10-CM | POA: Diagnosis not present

## 2022-06-12 DIAGNOSIS — M791 Myalgia, unspecified site: Secondary | ICD-10-CM

## 2022-06-12 LAB — MICROALBUMIN / CREATININE URINE RATIO
Creatinine,U: 118.2 mg/dL
Microalb Creat Ratio: 0.6 mg/g (ref 0.0–30.0)
Microalb, Ur: <0.7 mg/dL (ref 0.0–1.9)

## 2022-06-12 LAB — CBC WITH DIFFERENTIAL/PLATELET
Basophils Absolute: 0.1 K/uL (ref 0.0–0.1)
Basophils Relative: 1.2 % (ref 0.0–3.0)
Eosinophils Absolute: 0.2 K/uL (ref 0.0–0.7)
Eosinophils Relative: 3.8 % (ref 0.0–5.0)
HCT: 41.6 % (ref 36.0–46.0)
Hemoglobin: 13.7 g/dL (ref 12.0–15.0)
Lymphocytes Relative: 27.9 % (ref 12.0–46.0)
Lymphs Abs: 1.4 K/uL (ref 0.7–4.0)
MCHC: 33.1 g/dL (ref 30.0–36.0)
MCV: 91.8 fl (ref 78.0–100.0)
Monocytes Absolute: 0.6 K/uL (ref 0.1–1.0)
Monocytes Relative: 12.4 % — ABNORMAL HIGH (ref 3.0–12.0)
Neutro Abs: 2.7 K/uL (ref 1.4–7.7)
Neutrophils Relative %: 54.7 % (ref 43.0–77.0)
Platelets: 257 K/uL (ref 150.0–400.0)
RBC: 4.53 Mil/uL (ref 3.87–5.11)
RDW: 14 % (ref 11.5–15.5)
WBC: 4.9 K/uL (ref 4.0–10.5)

## 2022-06-12 LAB — COMPREHENSIVE METABOLIC PANEL WITH GFR
ALT: 21 U/L (ref 0–35)
AST: 22 U/L (ref 0–37)
Albumin: 4.3 g/dL (ref 3.5–5.2)
Alkaline Phosphatase: 67 U/L (ref 39–117)
BUN: 11 mg/dL (ref 6–23)
CO2: 24 meq/L (ref 19–32)
Calcium: 9.5 mg/dL (ref 8.4–10.5)
Chloride: 103 meq/L (ref 96–112)
Creatinine, Ser: 0.73 mg/dL (ref 0.40–1.20)
GFR: 83.96 mL/min
Glucose, Bld: 116 mg/dL — ABNORMAL HIGH (ref 70–99)
Potassium: 4.4 meq/L (ref 3.5–5.1)
Sodium: 137 meq/L (ref 135–145)
Total Bilirubin: 0.4 mg/dL (ref 0.2–1.2)
Total Protein: 7.6 g/dL (ref 6.0–8.3)

## 2022-06-12 LAB — HEMOGLOBIN A1C: Hgb A1c MFr Bld: 6.8 % — ABNORMAL HIGH (ref 4.6–6.5)

## 2022-06-12 LAB — LIPID PANEL
Cholesterol: 294 mg/dL — ABNORMAL HIGH (ref 0–200)
HDL: 77 mg/dL
LDL Cholesterol: 199 mg/dL — ABNORMAL HIGH (ref 0–99)
NonHDL: 216.75
Total CHOL/HDL Ratio: 4
Triglycerides: 91 mg/dL (ref 0.0–149.0)
VLDL: 18.2 mg/dL (ref 0.0–40.0)

## 2022-06-12 LAB — LDL CHOLESTEROL, DIRECT: Direct LDL: 201 mg/dL

## 2022-06-12 LAB — TSH: TSH: 1.5 u[IU]/mL (ref 0.35–5.50)

## 2022-06-12 MED ORDER — METFORMIN HCL ER 500 MG PO TB24: 500.0000 mg | ORAL_TABLET | Freq: Every day | ORAL | 1 refills | Status: DC

## 2022-06-12 MED ORDER — EZETIMIBE 10 MG PO TABS: 10.0000 mg | ORAL_TABLET | Freq: Every day | ORAL | 0 refills | Status: DC

## 2022-06-12 MED ORDER — LEVOTHYROXINE SODIUM 75 MCG PO TABS: 75.0000 ug | ORAL_TABLET | Freq: Every day | ORAL | 0 refills | Status: DC

## 2022-06-12 NOTE — Assessment & Plan Note (Addendum)
Untreated due to statin myalgia with trial of atorvastatin 40 mg every other day.   Sept 2023 Coronary  calcium score was 6 (LAD) .  Trial of Zetia recommended

## 2022-06-12 NOTE — Patient Instructions (Addendum)
You do have type 2 Diabetes,  but you are controlling it with diet thus far.      Try the metformin.  One tablet daily with or without food.    If you are willing to try Zetia, you may tolerate it better than the statins that you did not tolerate previously.  It works by inhibiting the absorption of cholesterol by the small intestine, so it lowers LDL .

## 2022-06-12 NOTE — Assessment & Plan Note (Addendum)
With obesity , hyperlipidemia and coronary atherosclerosis noted on Cardiac CT .Reviewed diagnosed made with a fasting glucose was 127, A1c of 6.6 .  Currently without medication her A1c has  risen to 6.8   She is exercising at the gym using silver sneakers but adding 20 minutes of aerobics  several times per week.  Reviewed lifestyle, not interested in ozempic but willing to try metformin. Zetia also recommended but deferred by patient   Lab Results  Component Value Date   HGBA1C 6.8 (H) 06/12/2022   Lab Results  Component Value Date   LABMICR See below: 02/22/2018   LABMICR See below: 06/03/2017   MICROALBUR <0.7 06/12/2022   MICROALBUR <0.7 04/28/2021      Lab Results  Component Value Date   CHOL 294 (H) 06/12/2022   HDL 77.00 06/12/2022   LDLCALC 199 (H) 06/12/2022   LDLDIRECT 201.0 06/12/2022   TRIG 91.0 06/12/2022   CHOLHDL 4 06/12/2022

## 2022-06-12 NOTE — Assessment & Plan Note (Signed)
Intolerant of every other day dosing as well

## 2022-06-12 NOTE — Progress Notes (Addendum)
Subjective:  Patient ID: Sheri Arnold, female    DOB: 10/06/1950  Age: 72 y.o. MRN: [id number]  CC: The primary encounter diagnosis was Colon cancer screening. Diagnoses of Encounter for screening mammogram for malignant neoplasm of breast, Obesity, diabetes, and hypertension syndrome ([place]), Hypothyroidism, unspecified type, Mixed hyperlipidemia, Other fatigue, Myalgia due to statin, and Type 2 diabetes mellitus with hyperlipidemia ([place]) were also pertinent to this visit.   HPI Sheri Arnold presents for  Chief Complaint  Patient presents with   Medical Management of Chronic Issues    Diabetes, and hypothyroidism   1) T2DM:  She  feels generally well,  But is not  exercising regularly or trying to lose weight. Not Checking  blood sugars  Denies any recent hypoglyemic SYMPTOMS. . Following a carbohydrate modified diet 6 days per week. Denies numbness, burning and tingling of extremities. Appetite is good/increased.  Wants to lose weight.  Intolerant of statins , even every other day lipitor due to myalgias.    Outpatient Medications Prior to Visit  Medication Sig Dispense Refill   betamethasone valerate (VALISONE) 0.1 % cream Apply topically daily. 45 g 1   levothyroxine (SYNTHROID) 75 MCG tablet TAKE 1 TABLET BY MOUTH EVERY DAY 90 tablet 0   minoxidil (LONITEN) 2.5 MG tablet TAKE 1 TABLET BY MOUTH EVERY DAY 30 tablet 1   atorvastatin (LIPITOR) 40 MG tablet TAKE 1 TABLET BY MOUTH EVERY OTHER DAY 45 tablet 0   No facility-administered medications prior to visit.    Review of Systems;  Patient denies headache, fevers, malaise, unintentional weight loss, skin rash, eye pain, sinus congestion and sinus pain, sore throat, dysphagia,  hemoptysis , cough, dyspnea, wheezing, chest pain, palpitations, orthopnea, edema, abdominal pain, nausea, melena, diarrhea, constipation, flank pain, dysuria, hematuria, urinary  Frequency, nocturia, numbness, tingling, seizures,  Focal weakness, Loss of  consciousness,  Tremor, insomnia, depression, anxiety, and suicidal ideation.      Objective:  BP 118/72   Pulse 61   Temp 98.2 F (36.8 C) (Oral)   Ht '5\' 5"'$  (1.651 m)   Wt 211 lb (95.7 kg)   SpO2 97%   BMI 35.11 kg/m   BP Readings from Last 3 Encounters:  06/12/22 118/72  04/28/22 120/64  04/28/21 122/68    Wt Readings from Last 3 Encounters:  06/12/22 211 lb (95.7 kg)  08/28/21 198 lb (89.8 kg)  04/28/21 202 lb 9.6 oz (91.9 kg)    Physical Exam Vitals reviewed.  Constitutional:      General: She is not in acute distress.    Appearance: Normal appearance. She is normal weight. She is not ill-appearing, toxic-appearing or diaphoretic.  HENT:     Head: Normocephalic.  Eyes:     General: No scleral icterus.       Right eye: No discharge.        Left eye: No discharge.     Conjunctiva/sclera: Conjunctivae normal.  Cardiovascular:     Rate and Rhythm: Normal rate and regular rhythm.     Heart sounds: Normal heart sounds.  Pulmonary:     Effort: Pulmonary effort is normal. No respiratory distress.     Breath sounds: Normal breath sounds.  Musculoskeletal:        General: Normal range of motion.  Skin:    General: Skin is warm and dry.  Neurological:     General: No focal deficit present.     Mental Status: She is alert and oriented to person, place, and time.  Mental status is at baseline.  Psychiatric:        Mood and Affect: Mood normal.        Behavior: Behavior normal.        Thought Content: Thought content normal.        Judgment: Judgment normal.     Lab Results  Component Value Date   HGBA1C 6.8 (H) 06/12/2022   HGBA1C 6.6 (H) 09/08/2021   HGBA1C 6.6 (H) 04/28/2021    Lab Results  Component Value Date   CREATININE 0.73 06/12/2022   CREATININE 0.71 10/30/2021   CREATININE 0.82 09/08/2021    Lab Results  Component Value Date   WBC 4.9 06/12/2022   HGB 13.7 06/12/2022   HCT 41.6 06/12/2022   PLT 257.0 06/12/2022   GLUCOSE 116 (H)  06/12/2022   CHOL 294 (H) 06/12/2022   TRIG 91.0 06/12/2022   HDL 77.00 06/12/2022   LDLDIRECT 201.0 06/12/2022   LDLCALC 199 (H) 06/12/2022   ALT 21 06/12/2022   AST 22 06/12/2022   NA 137 06/12/2022   K 4.4 06/12/2022   CL 103 06/12/2022   CREATININE 0.73 06/12/2022   BUN 11 06/12/2022   CO2 24 06/12/2022   TSH 1.50 06/12/2022   HGBA1C 6.8 (H) 06/12/2022   MICROALBUR <0.7 06/12/2022    CT CARDIAC SCORING (SELF PAY ONLY)  Addendum Date: 03/23/2022   ADDENDUM REPORT: 03/23/2022 11:21 EXAM: OVER-READ INTERPRETATION  CT CHEST The following report is an over-read performed by radiologist Dr. Davina Poke of [facility], [place] on 03/23/2022. This over-read does not include interpretation of cardiac or coronary anatomy or pathology. The coronary calcium score interpretation by the cardiologist is attached. COMPARISON:  None. FINDINGS: Normal heart size. No pericardial effusion. Image thoracic aorta is nonaneurysmal. Central pulmonary vasculature within normal limits. No adenopathy within the included chest. Imaged lung fields are clear. Small-moderate-sized hiatal hernia. No acute bony or chest wall abnormality. IMPRESSION: 1. No significant extracardiac findings. 2. Small-moderate-sized hiatal hernia. Electronically Signed   By: Davina Poke D.O.   On: 03/23/2022 11:21   Result Date: 03/23/2022 CLINICAL DATA:  Risk stratification EXAM: Coronary Calcium Score TECHNIQUE: The patient was scanned on a Siemens Somatom go.Top Scanner. Axial non-contrast 3 mm slices were carried out through the heart. The data set was analyzed on a dedicated work station and scored using the [place]. FINDINGS: Non-cardiac: See separate report from [facility]. Ascending Aorta: Normal size Pericardium: Normal Coronary arteries: Normal origin of left and right coronary arteries. Distribution of arterial calcifications if present, as noted below; LM 0 LAD 5.96 LCx 0 RCA 0 Total 5.96 IMPRESSION  AND RECOMMENDATION: 1. Coronary calcium score of 5.96. This was 47th percentile for age and sex matched control. 2. CAC 1-99 in LAD. CAC-DRS A1/N1. 3. Continue heart healthy lifestyle and risk factor modification. Electronically Signed: By: Kate Sable M.D. On: 03/23/2022 10:51    Assessment & Plan:  .Colon cancer screening -     Ambulatory referral to Gastroenterology  Encounter for screening mammogram for malignant neoplasm of breast -     3D Screening Mammogram, Left and Right; Future  Obesity, diabetes, and hypertension syndrome ([place]) Assessment & Plan: With obesity , hyperlipidemia and coronary atherosclerosis noted on Cardiac CT .Reviewed diagnosed made with a fasting glucose was 127, A1c of 6.6 .  Currently without medication her A1c has  risen to 6.8   She is exercising at the gym using silver sneakers but adding 20 minutes of aerobics  several  times per week.  Reviewed lifestyle, not interested in ozempic but willing to try metformin. Zetia also recommended but deferred by patient   Lab Results  Component Value Date   HGBA1C 6.8 (H) 06/12/2022   Lab Results  Component Value Date   LABMICR See below: 02/22/2018   LABMICR See below: 06/03/2017   MICROALBUR <0.7 06/12/2022   MICROALBUR <0.7 04/28/2021      Lab Results  Component Value Date   CHOL 294 (H) 06/12/2022   HDL 77.00 06/12/2022   LDLCALC 199 (H) 06/12/2022   LDLDIRECT 201.0 06/12/2022   TRIG 91.0 06/12/2022   CHOLHDL 4 06/12/2022      Hypothyroidism, unspecified type Assessment & Plan: Thyroid function is WNL on current dose of 75 mcg daily .  No current changes needed.   Lab Results  Component Value Date   TSH 1.50 06/12/2022     Orders: -     TSH  Mixed hyperlipidemia Assessment & Plan: Untreated due to statin myalgia with trial of atorvastatin 40 mg every other day.   Sept 2023 Coronary  calcium score was 6 (LAD) .  Trial of Zetia recommended   Orders: -     Lipid panel -     LDL  cholesterol, direct -     Comprehensive metabolic panel; Future  Other fatigue -     CBC with Differential/Platelet  Myalgia due to statin Assessment & Plan: Intolerant of every other day dosing as well    Type 2 diabetes mellitus with hyperlipidemia ([place]) Assessment & Plan: With obesity , hyperlipidemia and coronary atherosclerosis noted on Cardiac CT .Reviewed diagnosed made with a fasting glucose was 127, A1c of 6.6 .  Currently without medication her A1c has  risen to 6.8   She is exercising at the gym using silver sneakers but adding 20 minutes of aerobics  several times per week.  Reviewed lifestyle, not interested in ozempic but willing to try metformin. Zetia also recommended but deferred by patient   Lab Results  Component Value Date   HGBA1C 6.8 (H) 06/12/2022   Lab Results  Component Value Date   LABMICR See below: 02/22/2018   LABMICR See below: 06/03/2017   MICROALBUR <0.7 06/12/2022   MICROALBUR <0.7 04/28/2021      Lab Results  Component Value Date   CHOL 294 (H) 06/12/2022   HDL 77.00 06/12/2022   LDLCALC 199 (H) 06/12/2022   LDLDIRECT 201.0 06/12/2022   TRIG 91.0 06/12/2022   CHOLHDL 4 06/12/2022     Orders: -     Hemoglobin A1c -     Comprehensive metabolic panel -     Microalbumin / creatinine urine ratio  Other orders -     Levothyroxine Sodium; Take 1 tablet (75 mcg total) by mouth daily.  Dispense: 90 tablet; Refill: 0 -     metFORMIN HCl ER; Take 1 tablet (500 mg total) by mouth daily with breakfast.  Dispense: 90 tablet; Refill: 1 -     Ezetimibe; Take 1 tablet (10 mg total) by mouth daily.  Dispense: 90 tablet; Refill: 0     I provided 30 minutes of face-to-face time during this encounter reviewing patient's last visit with me,  recent surgical and non surgical procedures, previous  labs and imaging studies, counseling on diabetes , coroanry atherosclerosis an weight management    and post visit ordering to diagnostics and therapeutics .    Follow-up: Return in about 6 months (around 12/11/2022) for follow up diabetes.  Crecencio Mc, MD

## 2022-06-13 ENCOUNTER — Encounter: Payer: Self-pay | Admitting: Internal Medicine

## 2022-06-14 ENCOUNTER — Other Ambulatory Visit: Payer: Self-pay | Admitting: Dermatology

## 2022-06-14 DIAGNOSIS — L649 Androgenic alopecia, unspecified: Secondary | ICD-10-CM

## 2022-06-14 NOTE — Assessment & Plan Note (Signed)
Thyroid function is WNL on current dose of 75 mcg daily .  No current changes needed.   Lab Results  Component Value Date   TSH 1.50 06/12/2022

## 2022-06-16 ENCOUNTER — Encounter: Payer: Self-pay | Admitting: Internal Medicine

## 2022-06-17 ENCOUNTER — Telehealth: Payer: Self-pay | Admitting: Gastroenterology

## 2022-06-17 NOTE — Telephone Encounter (Signed)
Patient left a voicemail stating that she keeps getting calls from our office to schedule her a colonoscopy. I called her back and let her know what Dr. Marius Ditch had recommended from the last colonoscopy (repeat in 3 years). She states that she looked it up on the internet and the internet said that she didn't need a colonoscopy for 7 years so that is what she is going with. So therefore the patient wold like to wait to get her repeat colonoscopy. She stated she would like for [place] to stop calling her in reference to this.

## 2022-08-09 ENCOUNTER — Other Ambulatory Visit: Payer: Self-pay | Admitting: Internal Medicine

## 2022-08-26 ENCOUNTER — Telehealth: Payer: Self-pay | Admitting: Internal Medicine

## 2022-08-26 NOTE — Telephone Encounter (Signed)
Contacted Sheri Arnold to schedule their annual wellness visit. Appointment made for 09/07/2022.  Thank you,  [place] Direct dial  [phone]

## 2022-09-07 ENCOUNTER — Ambulatory Visit (INDEPENDENT_AMBULATORY_CARE_PROVIDER_SITE_OTHER): Payer: Medicare Other

## 2022-09-07 VITALS — Ht 65.0 in | Wt 211.0 lb

## 2022-09-07 DIAGNOSIS — Z0000 Encounter for general adult medical examination without abnormal findings: Secondary | ICD-10-CM

## 2022-09-07 NOTE — Patient Instructions (Addendum)
Sheri Arnold , Thank you for taking time to come for your Medicare Wellness Visit. I appreciate your ongoing commitment to your health goals. Please review the following plan we discussed and let me know if I can assist you in the future.   These are the goals we discussed:  Goals       Patient Stated     I want to lose a little weight (pt-stated)      Silver sneaker at home        This is a list of the screening recommended for you and due dates:  Health Maintenance  Topic Date Due   Complete foot exam   04/28/2022   DEXA scan (bone density measurement)  09/23/2022*   COVID-19 Vaccine (4 - 2023-24 season) 09/23/2022*   Pneumonia Vaccine (1 of 1 - PCV) 05/26/2023*   Colon Cancer Screening  10/24/2023*   Hemoglobin A1C  12/11/2022   Flu Shot  12/24/2022   Mammogram  02/21/2023   Eye exam for diabetics  04/09/2023   Yearly kidney function blood test for diabetes  06/13/2023   Yearly kidney health urinalysis for diabetes  06/13/2023   Medicare Annual Wellness Visit  09/07/2023   DTaP/Tdap/Td vaccine (3 - Tdap) 02/24/2027   Hepatitis C Screening: USPSTF Recommendation to screen - Ages 18-79 yo.  Completed   Zoster (Shingles) Vaccine  Completed   HPV Vaccine  Aged Out  *Topic was postponed. The date shown is not the original due date.    Advanced directives: on file  Conditions/risks identified: none new  Next appointment: Follow up in one year for your annual wellness visit    Preventive Care 65 Years and Older, Female Preventive care refers to lifestyle choices and visits with your health care provider that can promote health and wellness. What does preventive care include? A yearly physical exam. This is also called an annual well check. Dental exams once or twice a year. Routine eye exams. Ask your health care provider how often you should have your eyes checked. Personal lifestyle choices, including: Daily care of your teeth and gums. Regular physical activity. Eating  a healthy diet. Avoiding tobacco and drug use. Limiting alcohol use. Practicing safe sex. Taking low-dose aspirin every day. Taking vitamin and mineral supplements as recommended by your health care provider. What happens during an annual well check? The services and screenings done by your health care provider during your annual well check will depend on your age, overall health, lifestyle risk factors, and family history of disease. Counseling  Your health care provider may ask you questions about your: Alcohol use. Tobacco use. Drug use. Emotional well-being. Home and relationship well-being. Sexual activity. Eating habits. History of falls. Memory and ability to understand (cognition). Work and work Astronomer. Reproductive health. Screening  You may have the following tests or measurements: Height, weight, and BMI. Blood pressure. Lipid and cholesterol levels. These may be checked every 5 years, or more frequently if you are over 74 years old. Skin check. Lung cancer screening. You may have this screening every year starting at age 33 if you have a 30-pack-year history of smoking and currently smoke or have quit within the past 15 years. Fecal occult blood test (FOBT) of the stool. You may have this test every year starting at age 6. Flexible sigmoidoscopy or colonoscopy. You may have a sigmoidoscopy every 5 years or a colonoscopy every 10 years starting at age 62. Hepatitis C blood test. Hepatitis B blood test. Sexually transmitted  disease (STD) testing. Diabetes screening. This is done by checking your blood sugar (glucose) after you have not eaten for a while (fasting). You may have this done every 1-3 years. Bone density scan. This is done to screen for osteoporosis. You may have this done starting at age 77. Mammogram. This may be done every 1-2 years. Talk to your health care provider about how often you should have regular mammograms. Talk with your health care  provider about your test results, treatment options, and if necessary, the need for more tests. Vaccines  Your health care provider may recommend certain vaccines, such as: Influenza vaccine. This is recommended every year. Tetanus, diphtheria, and acellular pertussis (Tdap, Td) vaccine. You may need a Td booster every 10 years. Zoster vaccine. You may need this after age 22. Pneumococcal 13-valent conjugate (PCV13) vaccine. One dose is recommended after age 15. Pneumococcal polysaccharide (PPSV23) vaccine. One dose is recommended after age 76. Talk to your health care provider about which screenings and vaccines you need and how often you need them. This information is not intended to replace advice given to you by your health care provider. Make sure you discuss any questions you have with your health care provider. Document Released: 06/07/2015 Document Revised: 01/29/2016 Document Reviewed: 03/12/2015 Elsevier Interactive Patient Education  2017 [employer] Prevention in the Home Falls can cause injuries. They can happen to people of all ages. There are many things you can do to make your home safe and to help prevent falls. What can I do on the outside of my home? Regularly fix the edges of walkways and driveways and fix any cracks. Remove anything that might make you trip as you walk through a door, such as a raised step or threshold. Trim any bushes or trees on the path to your home. Use bright outdoor lighting. Clear any walking paths of anything that might make someone trip, such as rocks or tools. Regularly check to see if handrails are loose or broken. Make sure that both sides of any steps have handrails. Any raised decks and porches should have guardrails on the edges. Have any leaves, snow, or ice cleared regularly. Use sand or salt on walking paths during winter. Clean up any spills in your garage right away. This includes oil or grease spills. What can I do in the  bathroom? Use night lights. Install grab bars by the toilet and in the tub and shower. Do not use towel bars as grab bars. Use non-skid mats or decals in the tub or shower. If you need to sit down in the shower, use a plastic, non-slip stool. Keep the floor dry. Clean up any water that spills on the floor as soon as it happens. Remove soap buildup in the tub or shower regularly. Attach bath mats securely with double-sided non-slip rug tape. Do not have throw rugs and other things on the floor that can make you trip. What can I do in the bedroom? Use night lights. Make sure that you have a light by your bed that is easy to reach. Do not use any sheets or blankets that are too big for your bed. They should not hang down onto the floor. Have a firm chair that has side arms. You can use this for support while you get dressed. Do not have throw rugs and other things on the floor that can make you trip. What can I do in the kitchen? Clean up any spills right away. Avoid walking  on wet floors. Keep items that you use a lot in easy-to-reach places. If you need to reach something above you, use a strong step stool that has a grab bar. Keep electrical cords out of the way. Do not use floor polish or wax that makes floors slippery. If you must use wax, use non-skid floor wax. Do not have throw rugs and other things on the floor that can make you trip. What can I do with my stairs? Do not leave any items on the stairs. Make sure that there are handrails on both sides of the stairs and use them. Fix handrails that are broken or loose. Make sure that handrails are as long as the stairways. Check any carpeting to make sure that it is firmly attached to the stairs. Fix any carpet that is loose or worn. Avoid having throw rugs at the top or bottom of the stairs. If you do have throw rugs, attach them to the floor with carpet tape. Make sure that you have a light switch at the top of the stairs and the  bottom of the stairs. If you do not have them, ask someone to add them for you. What else can I do to help prevent falls? Wear shoes that: Do not have high heels. Have rubber bottoms. Are comfortable and fit you well. Are closed at the toe. Do not wear sandals. If you use a stepladder: Make sure that it is fully opened. Do not climb a closed stepladder. Make sure that both sides of the stepladder are locked into place. Ask someone to hold it for you, if possible. Clearly mark and make sure that you can see: Any grab bars or handrails. First and last steps. Where the edge of each step is. Use tools that help you move around (mobility aids) if they are needed. These include: Canes. Walkers. Scooters. Crutches. Turn on the lights when you go into a dark area. Replace any light bulbs as soon as they burn out. Set up your furniture so you have a clear path. Avoid moving your furniture around. If any of your floors are uneven, fix them. If there are any pets around you, be aware of where they are. Review your medicines with your doctor. Some medicines can make you feel dizzy. This can increase your chance of falling. Ask your doctor what other things that you can do to help prevent falls. This information is not intended to replace advice given to you by your health care provider. Make sure you discuss any questions you have with your health care provider. Document Released: 03/07/2009 Document Revised: 10/17/2015 Document Reviewed: 06/15/2014 Elsevier Interactive Patient Education  2017 [employer].

## 2022-09-07 NOTE — Progress Notes (Addendum)
Subjective:   Sheri Arnold is a 72 y.o. female who presents for Medicare Annual (Subsequent) preventive examination.  Review of Systems    No ROS.  Medicare Wellness Virtual Visit.  Visual/audio telehealth visit, UTA vital signs.   See social history for additional risk factors.   Cardiac Risk Factors include: advanced age (>57men, >27 women);diabetes mellitus     Objective:    Today's Vitals   09/07/22 1022  Weight: 211 lb (95.7 kg)  Height:  (1.651 m)   Body mass index is 35.11 kg/m.     09/07/2022   10:22 AM 08/28/2021    9:57 AM 08/21/2020   12:41 PM 08/21/2019   12:40 PM 06/12/2019    9:48 AM  Advanced Directives  Does Patient Have a Medical Advance Directive? Yes Yes Yes Yes Yes  Type of Estate agent of [place];Living will Healthcare Power of [place];Living will Healthcare Power of [employer] of [place];Living will Healthcare Power of [place];Living will;Out of facility DNR (pink MOST or yellow form)  Does patient want to make changes to medical advance directive? No - Patient declined No - Patient declined No - Patient declined No - Patient declined   Copy of Healthcare Power of Attorney in Chart? Yes - validated most recent copy scanned in chart (See row information) No - copy requested No - copy requested No - copy requested Yes - validated most recent copy scanned in chart (See row information)    Current Medications (verified) Outpatient Encounter Medications as of 09/07/2022  Medication Sig   betamethasone valerate (VALISONE) 0.1 % cream Apply topically daily.   ezetimibe (ZETIA) 10 MG tablet Take 1 tablet (10 mg total) by mouth daily. (Patient not taking: Reported on 09/07/2022)   levothyroxine (SYNTHROID) 75 MCG tablet TAKE 1 TABLET BY MOUTH EVERY DAY   metFORMIN (GLUCOPHAGE-XR) 500 MG 24 hr tablet Take 1 tablet (500 mg total) by mouth daily with breakfast.   minoxidil (LONITEN) 2.5 MG tablet TAKE 1 TABLET BY MOUTH  EVERY DAY   No facility-administered encounter medications on file as of 09/07/2022.    Allergies (verified) Epinephrine   History: Past Medical History:  Diagnosis Date   Hx of dysplastic nevus 08/22/2010   mid abdomen   Hyperlipidemia    Hypothyroidism    Past Surgical History:  Procedure Laterality Date   cataract surgery Bilateral 11/03/2021   COLONOSCOPY     COLONOSCOPY WITH PROPOFOL N/A 06/12/2019   Procedure: COLONOSCOPY WITH PROPOFOL;  Surgeon: Toney Reil, MD;  Location: [facility] ENDOSCOPY;  Service: Gastroenterology;  Laterality: N/A;   EYE SURGERY     Tummy Tuck  2010   Family History  Problem Relation Age of Onset   Diabetes Mother    Heart disease Father    Breast cancer Maternal Aunt    Hyperlipidemia Sister    Hyperlipidemia Brother    Breast cancer Cousin    Social History   Socioeconomic History   Marital status: Widowed    Spouse name: Not on file   Number of children: Not on file   Years of education: Not on file   Highest education level: Not on file  Occupational History   Not on file  Tobacco Use   Smoking status: Never   Smokeless tobacco: Never  Vaping Use   Vaping Use: Never used  Substance and Sexual Activity   Alcohol use: Yes    Alcohol/week: 7.0 standard drinks of alcohol    Types: 7 Shots  of liquor per week   Drug use: No   Sexual activity: Not on file  Other Topics Concern   Not on file  Social History Narrative   Not on file   Social Determinants of Health   Financial Resource Strain: Low Risk  (09/07/2022)   Overall Financial Resource Strain (CARDIA)    Difficulty of Paying Living Expenses: Not hard at all  Food Insecurity: No Food Insecurity (09/07/2022)   Hunger Vital Sign    Worried About Running Out of Food in the Last Year: Never true    Ran Out of Food in the Last Year: Never true  Transportation Needs: No Transportation Needs (09/07/2022)   PRAPARE - Administrator, Civil Service (Medical): No     Lack of Transportation (Non-Medical): No  Physical Activity: Sufficiently Active (09/07/2022)   Exercise Vital Sign    Days of Exercise per Week: 7 days    Minutes of Exercise per Session: 30 min  Stress: No Stress Concern Present (09/07/2022)   [employer] of Occupational Health - Occupational Stress Questionnaire    Feeling of Stress : Not at all  Social Connections: Moderately Integrated (09/07/2022)   Social Connection and Isolation Panel [NHANES]    Frequency of Communication with Friends and Family: More than three times a week    Frequency of Social Gatherings with Friends and Family: More than three times a week    Attends Religious Services: More than 4 times per year    Active Member of [employer] or Organizations: Yes    Attends Banker Meetings: More than 4 times per year    Marital Status: Widowed    Tobacco Counseling Counseling given: Not Answered   Clinical Intake:  Pre-visit preparation completed: Yes        Diabetes: Yes (Followed by pcp)  Nutrition Risk Assessment: Has the patient had any N/V/D within the last 2 months?  No  Does the patient have any non-healing wounds?  No  Has the patient had any unintentional weight loss or weight gain?  No   Diabetes: Is the patient diabetic?  Yes  If diabetic, was a CBG obtained today?  No  Did the patient bring in their glucometer from home?  No  How often do you monitor your CBG's? Never.   Financial Strains and Diabetes Management: Are you having any financial strains with the device, your supplies or your medication? No .  Does the patient want to be seen by Chronic Care Management for management of their diabetes?  No  Would the patient like to be referred to a Nutritionist or for Diabetic Management?  No    How often do you need to have someone help you when you read instructions, pamphlets, or other written materials from your doctor or pharmacy?: 1 - Never    Interpreter Needed?: No       Activities of Daily Living    09/07/2022   10:28 AM  In your present state of health, do you have any difficulty performing the following activities:  Hearing? 0  Vision? 0  Difficulty concentrating or making decisions? 0  Walking or climbing stairs? 0  Dressing or bathing? 0  Doing errands, shopping? 0  Preparing Food and eating ? N  Using the Toilet? N  In the past six months, have you accidently leaked urine? N  Do you have problems with loss of bowel control? N  Managing your Medications? N  Managing your Finances? N  Housekeeping or managing your Housekeeping? N    Patient Care Team: Sherlene Shams, MD as PCP - General (Internal Medicine)  Indicate any recent Medical Services you may have received from other than [facility] providers in the past year (date may be approximate).     Assessment:   This is a routine wellness examination for Sheri Arnold.  I connected with  Sheri Arnold on 09/07/22 by a audio enabled telemedicine application and verified that I am speaking with the correct person using two identifiers.  Patient Location: Home  Provider Location: Office/Clinic  I discussed the limitations of evaluation and management by telemedicine. The patient expressed understanding and agreed to proceed.   Hearing/Vision screen Hearing Screening - Comments:: Patient is able to hear conversational tones without difficulty.  No issues reported.   Vision Screening - Comments:: Followed by Dr. Clydene Pugh  Cataracts extracted, bilateral They have seen their ophthalmologist in the last 12 months.  Dietary issues and exercise activities discussed: Current Exercise Habits: Home exercise routine, Type of exercise: calisthenics;stretching;walking (Chair yoga), Time (Minutes): 30, Frequency (Times/Week): 3, Weekly Exercise (Minutes/Week): 90, Intensity: Mild Modified intermittent diet   Goals Addressed               This Visit's Progress     Patient Stated     I want to lose  a little weight (pt-stated)        Silver sneaker at home       Depression Screen    09/07/2022   10:28 AM 06/12/2022   10:02 AM 08/28/2021    9:53 AM 04/28/2021    3:58 PM 01/21/2021    4:32 PM 09/25/2020   10:59 AM 08/21/2020   12:34 PM  PHQ 2/9 Scores  PHQ - 2 Score 0 0 0 0 0 0 0    Fall Risk    09/07/2022   10:28 AM 06/12/2022   10:02 AM 08/28/2021    9:53 AM 04/28/2021    3:58 PM 01/21/2021    4:31 PM  Fall Risk   Falls in the past year? 0 0 0 0 0  Number falls in past yr: 0 0 0    Injury with Fall? 0 0     Risk for fall due to :  No Fall Risks  No Fall Risks   Follow up Falls evaluation completed;Falls prevention discussed Falls evaluation completed Falls evaluation completed Falls evaluation completed Falls evaluation completed    FALL RISK PREVENTION PERTAINING TO THE HOME: Home free of loose throw rugs in walkways, pet beds, electrical cords, etc? Yes  Adequate lighting in your home to reduce risk of falls? Yes   ASSISTIVE DEVICES UTILIZED TO PREVENT FALLS: Life alert? No  Use of a cane, walker or w/c? No  Grab bars in the bathroom? Yes  Shower chair or bench in shower? Yes  Elevated toilet seat or a handicapped toilet? Yes   TIMED UP AND GO: Was the test performed? No .   Cognitive Function:        09/07/2022   10:36 AM 08/21/2019   12:56 PM  6CIT Screen  What Year? 0 points 0 points  What month? 0 points 0 points  What time? 0 points 0 points  Count back from 20 0 points   Months in reverse 0 points   Repeat phrase 0 points   Total Score 0 points     Immunizations Immunization History  Administered Date(s) Administered   Influenza,inj,Quad PF,6+ Mos 02/16/2017  Influenza-Unspecified 03/05/2015, 03/25/2016   MMR 11/22/2018, 03/06/2019   PFIZER(Purple Top)SARS-COV-2 Vaccination 07/08/2019, 07/31/2019, 02/23/2020   Td 01/14/2007, 02/23/2017   Zoster Recombinat (Shingrix) 06/03/2021, 08/11/2021   Pneumococcal vaccine status: Declined,  Education has  been provided regarding the importance of this vaccine but patient still declined. Advised may receive this vaccine at local pharmacy or Health Dept. Aware to provide a copy of the vaccination record if obtained from local pharmacy or Health Dept. Verbalized acceptance and understanding.   Screening Tests Health Maintenance  Topic Date Due   FOOT EXAM  04/28/2022   DEXA SCAN  09/23/2022 (Originally 02/06/2018)   COVID-19 Vaccine (4 - 2023-24 season) 09/23/2022 (Originally 01/23/2022)   Pneumonia Vaccine 91+ Years old (1 of 1 - PCV) 05/26/2023 (Originally 02/06/2018)   COLONOSCOPY (Pts 45-79yrs Insurance coverage will need to be confirmed)  10/24/2023 (Originally 06/11/2022)   HEMOGLOBIN A1C  12/11/2022   INFLUENZA VACCINE  12/24/2022   MAMMOGRAM  02/21/2023   OPHTHALMOLOGY EXAM  04/09/2023   Diabetic kidney evaluation - eGFR measurement  06/13/2023   Diabetic kidney evaluation - Urine ACR  06/13/2023   Medicare Annual Wellness (AWV)  09/07/2023   DTaP/Tdap/Td (3 - Tdap) 02/24/2027   Hepatitis C Screening  Completed   Zoster Vaccines- Shingrix  Completed   HPV VACCINES  Aged Out    Health Maintenance Health Maintenance Due  Topic Date Due   FOOT EXAM  04/28/2022   Colonoscopy- declined.   Lung Cancer Screening: (Low Dose CT Chest recommended if Age 3-80 years, 30 pack-year currently smoking OR have quit w/in 15years.) does not qualify.   Vision Screening: Recommended annual ophthalmology exams for early detection of glaucoma and other disorders of the eye.  Dental Screening: Recommended annual dental exams for proper oral hygiene  Community Resource Referral / Chronic Care Management: CRR required this visit?  No   CCM required this visit?  No      Plan:     I have personally reviewed and noted the following in the patient's chart:   Medical and social history Use of alcohol, tobacco or illicit drugs  Current medications and supplements including opioid prescriptions.  Patient is not currently taking opioid prescriptions. Functional ability and status Nutritional status Physical activity Advanced directives List of other physicians Hospitalizations, surgeries, and ER visits in previous 12 months Vitals Screenings to include cognitive, depression, and falls Referrals and appointments  In addition, I have reviewed and discussed with patient certain preventive protocols, quality metrics, and best practice recommendations. A written personalized care plan for preventive services as well as general preventive health recommendations were provided to patient.     Rennie Rouch L Motley, LPN   0/02/2724     I have reviewed the above information and agree with above.   Duncan Dull, MD

## 2022-10-09 ENCOUNTER — Encounter: Payer: Self-pay | Admitting: Nurse Practitioner

## 2022-10-09 ENCOUNTER — Ambulatory Visit (INDEPENDENT_AMBULATORY_CARE_PROVIDER_SITE_OTHER): Payer: Medicare Other | Admitting: Nurse Practitioner

## 2022-10-09 VITALS — BP 120/68 | HR 65 | Temp 97.8°F | Ht 65.0 in | Wt 204.2 lb

## 2022-10-09 DIAGNOSIS — L03039 Cellulitis of unspecified toe: Secondary | ICD-10-CM | POA: Diagnosis not present

## 2022-10-09 MED ORDER — MUPIROCIN 2 % EX OINT: 1.0000 | TOPICAL_OINTMENT | Freq: Two times a day (BID) | CUTANEOUS | 0 refills | Status: DC

## 2022-10-09 NOTE — Progress Notes (Signed)
Established Patient Office Visit  Subjective:  Patient ID: Sheri Arnold, female    DOB: 18-Oct-1950  Age: 72 y.o. MRN: [id number]  CC:  Chief Complaint  Patient presents with   Laceration    On right foot big toe looks red     HPI  Sheri Arnold presents for laceration on right big toe about 2 weeks ago by accidentally getting her toe on the side of the chair.  She had recently noticed that the toe is red and was hurting last night.  Denies any fever, discharge or blood oozing from the laceration.   HPI   Past Medical History:  Diagnosis Date   Hx of dysplastic nevus 08/22/2010   mid abdomen   Hyperlipidemia    Hypothyroidism     Past Surgical History:  Procedure Laterality Date   cataract surgery Bilateral 11/03/2021   COLONOSCOPY     COLONOSCOPY WITH PROPOFOL N/A 06/12/2019   Procedure: COLONOSCOPY WITH PROPOFOL;  Surgeon: Toney Reil, MD;  Location: [facility] ENDOSCOPY;  Service: Gastroenterology;  Laterality: N/A;   EYE SURGERY     Tummy Tuck  2010    Family History  Problem Relation Age of Onset   Diabetes Mother    Heart disease Father    Breast cancer Maternal Aunt    Hyperlipidemia Sister    Hyperlipidemia Brother    Breast cancer Cousin     Social History   Socioeconomic History   Marital status: Widowed    Spouse name: Not on file   Number of children: Not on file   Years of education: Not on file   Highest education level: Not on file  Occupational History   Not on file  Tobacco Use   Smoking status: Never   Smokeless tobacco: Never  Vaping Use   Vaping Use: Never used  Substance and Sexual Activity   Alcohol use: Yes    Alcohol/week: 7.0 standard drinks of alcohol    Types: 7 Shots of liquor per week   Drug use: No   Sexual activity: Not on file  Other Topics Concern   Not on file  Social History Narrative   Not on file   Social Determinants of Health   Financial Resource Strain: Low Risk  (09/07/2022)   Overall Financial  Resource Strain (CARDIA)    Difficulty of Paying Living Expenses: Not hard at all  Food Insecurity: No Food Insecurity (09/07/2022)   Hunger Vital Sign    Worried About Running Out of Food in the Last Year: Never true    Ran Out of Food in the Last Year: Never true  Transportation Needs: No Transportation Needs (09/07/2022)   PRAPARE - Administrator, Civil Service (Medical): No    Lack of Transportation (Non-Medical): No  Physical Activity: Sufficiently Active (09/07/2022)   Exercise Vital Sign    Days of Exercise per Week: 7 days    Minutes of Exercise per Session: 30 min  Stress: No Stress Concern Present (09/07/2022)   [employer] of Occupational Health - Occupational Stress Questionnaire    Feeling of Stress : Not at all  Social Connections: Moderately Integrated (09/07/2022)   Social Connection and Isolation Panel [NHANES]    Frequency of Communication with Friends and Family: More than three times a week    Frequency of Social Gatherings with Friends and Family: More than three times a week    Attends Religious Services: More than 4 times per year  Active Member of Clubs or Organizations: Yes    Attends Banker Meetings: More than 4 times per year    Marital Status: Widowed  Intimate Partner Violence: Not At Risk (09/07/2022)   Humiliation, Afraid, Rape, and Kick questionnaire    Fear of Current or Ex-Partner: No    Emotionally Abused: No    Physically Abused: No    Sexually Abused: No     Outpatient Medications Prior to Visit  Medication Sig Dispense Refill   betamethasone valerate (VALISONE) 0.1 % cream Apply topically daily. 45 g 1   levothyroxine (SYNTHROID) 75 MCG tablet TAKE 1 TABLET BY MOUTH EVERY DAY 90 tablet 0   metFORMIN (GLUCOPHAGE-XR) 500 MG 24 hr tablet Take 1 tablet (500 mg total) by mouth daily with breakfast. 90 tablet 1   minoxidil (LONITEN) 2.5 MG tablet TAKE 1 TABLET BY MOUTH EVERY DAY 90 tablet 1   ezetimibe (ZETIA) 10 MG  tablet Take 1 tablet (10 mg total) by mouth daily. (Patient not taking: Reported on 09/07/2022) 90 tablet 0   No facility-administered medications prior to visit.    Allergies  Allergen Reactions   Epinephrine Anxiety    ROS Review of Systems  Constitutional: Negative.   Respiratory: Negative.    Cardiovascular: Negative.   Musculoskeletal:        Big toe slightly erythematous  Neurological: Negative.   Psychiatric/Behavioral: Negative.        Objective:    Physical Exam Constitutional:      Appearance: Normal appearance.  HENT:     Head: Normocephalic and atraumatic.     Nose: Nose normal.  Eyes:     Pupils: Pupils are equal, round, and reactive to light.  Cardiovascular:     Rate and Rhythm: Normal rate and regular rhythm.     Pulses: Normal pulses.     Heart sounds: No murmur heard.    No friction rub.  Pulmonary:     Effort: Pulmonary effort is normal.     Breath sounds: Normal breath sounds. No stridor. No wheezing.  Musculoskeletal:     Comments: Slight erythema, slightly warm, no discharges: right big toe  Neurological:     Mental Status: She is alert.     BP 120/68   Pulse 65   Temp 97.8 F (36.6 C)   Ht 5\' 5"  (1.651 m)   Wt 204 lb 3.2 oz (92.6 kg)   SpO2 96%   BMI 33.98 kg/m  Wt Readings from Last 3 Encounters:  10/09/22 204 lb 3.2 oz (92.6 kg)  09/07/22 211 lb (95.7 kg)  06/12/22 211 lb (95.7 kg)     Health Maintenance  Topic Date Due   DEXA SCAN  Never done   COVID-19 Vaccine (4 - 2023-24 season) 01/23/2022   FOOT EXAM  04/28/2022   Pneumonia Vaccine 62+ Years old (1 of 1 - PCV) 05/26/2023 (Originally 02/06/2018)   Colonoscopy  10/24/2023 (Originally 06/11/2022)   HEMOGLOBIN A1C  12/11/2022   INFLUENZA VACCINE  12/24/2022   MAMMOGRAM  02/21/2023   OPHTHALMOLOGY EXAM  04/09/2023   Diabetic kidney evaluation - eGFR measurement  06/13/2023   Diabetic kidney evaluation - Urine ACR  06/13/2023   Medicare Annual Wellness (AWV)  09/07/2023    DTaP/Tdap/Td (3 - Tdap) 02/24/2027   Hepatitis C Screening  Completed   Zoster Vaccines- Shingrix  Completed   HPV VACCINES  Aged Out    There are no preventive care reminders to display for this patient.  Lab  Results  Component Value Date   TSH 1.50 06/12/2022   Lab Results  Component Value Date   WBC 4.9 06/12/2022   HGB 13.7 06/12/2022   HCT 41.6 06/12/2022   MCV 91.8 06/12/2022   PLT 257.0 06/12/2022   Lab Results  Component Value Date   NA 137 06/12/2022   K 4.4 06/12/2022   CO2 24 06/12/2022   GLUCOSE 116 (H) 06/12/2022   BUN 11 06/12/2022   CREATININE 0.73 06/12/2022   BILITOT 0.4 06/12/2022   ALKPHOS 67 06/12/2022   AST 22 06/12/2022   ALT 21 06/12/2022   PROT 7.6 06/12/2022   ALBUMIN 4.3 06/12/2022   CALCIUM 9.5 06/12/2022   GFR 83.96 06/12/2022   Lab Results  Component Value Date   CHOL 294 (H) 06/12/2022   Lab Results  Component Value Date   HDL 77.00 06/12/2022   Lab Results  Component Value Date   LDLCALC 199 (H) 06/12/2022   Lab Results  Component Value Date   TRIG 91.0 06/12/2022   Lab Results  Component Value Date   CHOLHDL 4 06/12/2022   Lab Results  Component Value Date   HGBA1C 6.8 (H) 06/12/2022      Assessment & Plan:  Paronychia of great toe Assessment & Plan: Slight erythema but no discharges. Will treat with Bactroban ointment twice a day for 5 days. If symptoms not improving call the office back for evaluation.   Other orders -     Mupirocin; Apply 1 Application topically 2 (two) times daily.  Dispense: 22 g; Refill: 0    Follow-up: Return if symptoms worsen or fail to improve.   Kara Dies, NP

## 2022-10-16 ENCOUNTER — Encounter: Payer: Self-pay | Admitting: Internal Medicine

## 2022-10-19 DIAGNOSIS — L03039 Cellulitis of unspecified toe: Secondary | ICD-10-CM | POA: Insufficient documentation

## 2022-10-19 NOTE — Assessment & Plan Note (Signed)
Slight erythema but no discharges. Will treat with Bactroban ointment twice a day for 5 days. If symptoms not improving call the office back for evaluation.

## 2022-10-20 MED ORDER — BETAMETHASONE VALERATE 0.1 % EX CREA: TOPICAL_CREAM | Freq: Every day | CUTANEOUS | 1 refills | Status: DC

## 2022-10-20 NOTE — Telephone Encounter (Signed)
Last filled by Dr. Dossie Arbour.

## 2022-11-03 ENCOUNTER — Other Ambulatory Visit: Payer: Self-pay | Admitting: Internal Medicine

## 2022-12-05 ENCOUNTER — Other Ambulatory Visit: Payer: Self-pay | Admitting: Internal Medicine

## 2022-12-07 ENCOUNTER — Other Ambulatory Visit: Payer: Self-pay | Admitting: Dermatology

## 2022-12-07 DIAGNOSIS — L649 Androgenic alopecia, unspecified: Secondary | ICD-10-CM

## 2022-12-09 ENCOUNTER — Encounter: Payer: Self-pay | Admitting: Internal Medicine

## 2022-12-11 ENCOUNTER — Ambulatory Visit: Payer: Medicare Other | Admitting: Internal Medicine

## 2022-12-14 ENCOUNTER — Encounter: Payer: Self-pay | Admitting: Internal Medicine

## 2022-12-14 ENCOUNTER — Ambulatory Visit (INDEPENDENT_AMBULATORY_CARE_PROVIDER_SITE_OTHER): Payer: Medicare Other | Admitting: Internal Medicine

## 2022-12-14 VITALS — BP 118/70 | HR 91 | Temp 98.0°F | Ht 65.0 in | Wt 201.4 lb

## 2022-12-14 DIAGNOSIS — E785 Hyperlipidemia, unspecified: Secondary | ICD-10-CM | POA: Diagnosis not present

## 2022-12-14 DIAGNOSIS — E1169 Type 2 diabetes mellitus with other specified complication: Secondary | ICD-10-CM | POA: Diagnosis not present

## 2022-12-14 DIAGNOSIS — E782 Mixed hyperlipidemia: Secondary | ICD-10-CM

## 2022-12-14 DIAGNOSIS — R7309 Other abnormal glucose: Secondary | ICD-10-CM

## 2022-12-14 LAB — COMPREHENSIVE METABOLIC PANEL
ALT: 19 U/L (ref 0–35)
AST: 19 U/L (ref 0–37)
Albumin: 4.2 g/dL (ref 3.5–5.2)
Alkaline Phosphatase: 68 U/L (ref 39–117)
BUN: 13 mg/dL (ref 6–23)
CO2: 25 mEq/L (ref 19–32)
Calcium: 9.6 mg/dL (ref 8.4–10.5)
Chloride: 104 mEq/L (ref 96–112)
Creatinine, Ser: 0.7 mg/dL (ref 0.40–1.20)
GFR: 87.98 mL/min (ref >60.00)
Glucose, Bld: 119 mg/dL — ABNORMAL HIGH (ref 70–99)
Potassium: 4.8 mEq/L (ref 3.5–5.1)
Sodium: 138 mEq/L (ref 135–145)
Total Bilirubin: 0.4 mg/dL (ref 0.2–1.2)
Total Protein: 7 g/dL (ref 6.0–8.3)

## 2022-12-14 LAB — POCT GLYCOSYLATED HEMOGLOBIN (HGB A1C): Hemoglobin A1C: 6.1 % — AB (ref 4.0–5.6)

## 2022-12-14 MED ORDER — EZETIMIBE 10 MG PO TABS: 10.0000 mg | ORAL_TABLET | Freq: Every day | ORAL | 0 refills | Status: DC

## 2022-12-14 NOTE — Progress Notes (Signed)
Subjective:  Patient ID: Sheri Arnold, female    DOB: [birth year]  Age: 72 y.o. MRN: [id number]  CC: The primary encounter diagnosis was Elevated glucose. Diagnoses of Mixed hyperlipidemia and Type 2 diabetes mellitus with hyperlipidemia (HCC) were also pertinent to this visit.   HPI Sheri Arnold presents for  Chief Complaint  Patient presents with   Medical Management of Chronic Issues    6 mnth f/up for DM   1) T2DM :    She  feels generally well,  But is not  exercising regularly or trying to lose weight. Not Checking  blood sugars .  Denies any recent hypoglyemic symptoms. Taking metformin . Following a carbohydrate modified diet 6 days per week. Denies numbness, burning and tingling of extremities. Appetite is good.  Statin intolerant   2) HLD:  she declines statin repeat trial  but she is willing to try zetia to lower LDL after discussion of the potential side effects and benefits.   Lab Results  Component Value Date   CHOL 294 (H) 06/12/2022   HDL 77.00 06/12/2022   LDLCALC 199 (H) 06/12/2022   LDLDIRECT 201.0 06/12/2022   TRIG 91.0 06/12/2022   CHOLHDL 4 06/12/2022    3)    Outpatient Medications Prior to Visit  Medication Sig Dispense Refill   betamethasone valerate (VALISONE) 0.1 % cream Apply topically daily. 45 g 1   levothyroxine (SYNTHROID) 75 MCG tablet TAKE 1 TABLET BY MOUTH EVERY DAY 90 tablet 1   metFORMIN (GLUCOPHAGE-XR) 500 MG 24 hr tablet TAKE 1 TABLET BY MOUTH EVERY DAY WITH BREAKFAST 90 tablet 1   minoxidil (LONITEN) 2.5 MG tablet TAKE 1 TABLET BY MOUTH EVERY DAY 90 tablet 1   mupirocin ointment (BACTROBAN) 2 % Apply 1 Application topically 2 (two) times daily. 22 g 0   ezetimibe (ZETIA) 10 MG tablet Take 1 tablet (10 mg total) by mouth daily. (Patient not taking: Reported on 09/07/2022) 90 tablet 0   No facility-administered medications prior to visit.    Review of Systems;  Patient denies headache, fevers, malaise, unintentional weight loss, skin  rash, eye pain, sinus congestion and sinus pain, sore throat, dysphagia,  hemoptysis , cough, dyspnea, wheezing, chest pain, palpitations, orthopnea, edema, abdominal pain, nausea, melena, diarrhea, constipation, flank pain, dysuria, hematuria, urinary  Frequency, nocturia, numbness, tingling, seizures,  Focal weakness, Loss of consciousness,  Tremor, insomnia, depression, anxiety, and suicidal ideation.      Objective:  BP 118/70   Pulse 91   Temp 98 F (36.7 C) (Oral)   Ht 5\' 5"  (1.651 m)   Wt 201 lb 6.4 oz (91.4 kg)   SpO2 98%   BMI 33.51 kg/m   BP Readings from Last 3 Encounters:  12/14/22 118/70  10/09/22 120/68  06/12/22 118/72    Wt Readings from Last 3 Encounters:  12/14/22 201 lb 6.4 oz (91.4 kg)  10/09/22 204 lb 3.2 oz (92.6 kg)  09/07/22 211 lb (95.7 kg)    Physical Exam Vitals reviewed.  Constitutional:      General: She is not in acute distress.    Appearance: Normal appearance. She is normal weight. She is not ill-appearing, toxic-appearing or diaphoretic.  HENT:     Head: Normocephalic.  Eyes:     General: No scleral icterus.       Right eye: No discharge.        Left eye: No discharge.     Conjunctiva/sclera: Conjunctivae normal.  Cardiovascular:  Rate and Rhythm: Normal rate and regular rhythm.     Heart sounds: Normal heart sounds.  Pulmonary:     Effort: Pulmonary effort is normal. No respiratory distress.     Breath sounds: Normal breath sounds.  Musculoskeletal:        General: Normal range of motion.  Skin:    General: Skin is warm and dry.  Neurological:     General: No focal deficit present.     Mental Status: She is alert and oriented to person, place, and time. Mental status is at baseline.  Psychiatric:        Mood and Affect: Mood normal.        Behavior: Behavior normal.        Thought Content: Thought content normal.        Judgment: Judgment normal.     Lab Results  Component Value Date   HGBA1C 6.1 (A) 12/14/2022    HGBA1C 6.8 (H) 06/12/2022   HGBA1C 6.6 (H) 09/08/2021    Lab Results  Component Value Date   CREATININE 0.70 12/14/2022   CREATININE 0.73 06/12/2022   CREATININE 0.71 10/30/2021    Lab Results  Component Value Date   WBC 4.9 06/12/2022   HGB 13.7 06/12/2022   HCT 41.6 06/12/2022   PLT 257.0 06/12/2022   GLUCOSE 119 (H) 12/14/2022   CHOL 294 (H) 06/12/2022   TRIG 91.0 06/12/2022   HDL 77.00 06/12/2022   LDLDIRECT 201.0 06/12/2022   LDLCALC 199 (H) 06/12/2022   ALT 19 12/14/2022   AST 19 12/14/2022   NA 138 12/14/2022   K 4.8 12/14/2022   CL 104 12/14/2022   CREATININE 0.70 12/14/2022   BUN 13 12/14/2022   CO2 25 12/14/2022   TSH 1.50 06/12/2022   HGBA1C 6.1 (A) 12/14/2022   MICROALBUR <0.7 06/12/2022    CT CARDIAC SCORING (SELF PAY ONLY)  Addendum Date: 03/23/2022   ADDENDUM REPORT: 03/23/2022 11:21 EXAM: OVER-READ INTERPRETATION  CT CHEST The following report is an over-read performed by radiologist Dr. Duanne Guess of [facility] on 03/23/2022. This over-read does not include interpretation of cardiac or coronary anatomy or pathology. The coronary calcium score interpretation by the cardiologist is attached. COMPARISON:  None. FINDINGS: Normal heart size. No pericardial effusion. Image thoracic aorta is nonaneurysmal. Central pulmonary vasculature within normal limits. No adenopathy within the included chest. Imaged lung fields are clear. Small-moderate-sized hiatal hernia. No acute bony or chest wall abnormality. IMPRESSION: 1. No significant extracardiac findings. 2. Small-moderate-sized hiatal hernia. Electronically Signed   By: Duanne Guess D.O.   On: 03/23/2022 11:21   Result Date: 03/23/2022 CLINICAL DATA:  Risk stratification EXAM: Coronary Calcium Score TECHNIQUE: The patient was scanned on a Siemens Somatom go.Top Scanner. Axial non-contrast 3 mm slices were carried out through the heart. The data set was analyzed on a dedicated work station and  scored using the Agatson method. FINDINGS: Non-cardiac: See separate report from [facility]. Ascending Aorta: Normal size Pericardium: Normal Coronary arteries: Normal origin of left and right coronary arteries. Distribution of arterial calcifications if present, as noted below; LM 0 LAD 5.96 LCx 0 RCA 0 Total 5.96 IMPRESSION AND RECOMMENDATION: 1. Coronary calcium score of 5.96. This was 47th percentile for age and sex matched control. 2. CAC 1-99 in LAD. CAC-DRS A1/N1. 3. Continue heart healthy lifestyle and risk factor modification. Electronically Signed: By: Debbe Odea M.D. On: 03/23/2022 10:51    Assessment & Plan:  .Elevated glucose -  POCT glycosylated hemoglobin (Hb A1C)  Mixed hyperlipidemia Assessment & Plan: Untreated due to statin myalgia with trial of atorvastatin 40 mg every other day.   Sept 2023 Coronary  calcium score was 6 (LAD) . , statin therapy advised given concurrent T2DM    Trial of Zetia was recommended but deferred by patient until today    Type 2 diabetes mellitus with hyperlipidemia (HCC) Assessment & Plan: With obesity , hyperlipidemia and coronary atherosclerosis noted on Cardiac CT .Reviewed diagnosed made with a fasting glucose was 127, A1c of 6.6 .  Currently  with metformin her A1c  is reduced to 6.1 She is exercising at the gym using silver sneakers and swimming daily .   Zetia again recommended and she has accepted a trial  (she has a history of statin myalgia).   Lab Results  Component Value Date   HGBA1C 6.1 (A) 12/14/2022   Lab Results  Component Value Date   LABMICR See below: 02/22/2018   LABMICR See below: 06/03/2017   MICROALBUR <0.7 06/12/2022   MICROALBUR <0.7 04/28/2021      Lab Results  Component Value Date   CHOL 294 (H) 06/12/2022   HDL 77.00 06/12/2022   LDLCALC 199 (H) 06/12/2022   LDLDIRECT 201.0 06/12/2022   TRIG 91.0 06/12/2022   CHOLHDL 4 06/12/2022     Orders: -     Comprehensive metabolic panel -      Lipid Panel w/reflex Direct LDL  Other orders -     Ezetimibe; Take 1 tablet (10 mg total) by mouth daily.  Dispense: 90 tablet; Refill: 0     I provided 30 minutes of face-to-face time during this encounter reviewing patient's last visit with me, previous non surgical procedures, previous  labs and imaging studies, counseling on currently addressed issues,  and post visit ordering to diagnostics and therapeutics .   Follow-up: Return in about 6 months (around 06/16/2023) for follow up diabetes, physical.   Sherlene Shams, MD

## 2022-12-14 NOTE — Patient Instructions (Addendum)
Your diabetes remains under excellent control  but your cholesterol is still elevated without medication  I''m glad you are willing to try Zetia, you should tolerate it better than the statins that you did not tolerate previously.  It works by inhibiting the absorption of cholesterol by the small intestine, so it lowers LDL .   Please return for non fasting labs (liver and muscle enzymes) in 3-4 weeks

## 2022-12-14 NOTE — Assessment & Plan Note (Addendum)
With obesity , hyperlipidemia and coronary atherosclerosis noted on Cardiac CT .Reviewed diagnosed made with a fasting glucose was 127, A1c of 6.6 .  Currently  with metformin her A1c  is reduced to 6.1 She is exercising at the gym using silver sneakers and swimming daily .   Zetia again recommended and she has accepted a trial  (she has a history of statin myalgia).   Lab Results  Component Value Date   HGBA1C 6.1 (A) 12/14/2022   Lab Results  Component Value Date   LABMICR See below: 02/22/2018   LABMICR See below: 06/03/2017   MICROALBUR <0.7 06/12/2022   MICROALBUR <0.7 04/28/2021      Lab Results  Component Value Date   CHOL 294 (H) 06/12/2022   HDL 77.00 06/12/2022   LDLCALC 199 (H) 06/12/2022   LDLDIRECT 201.0 06/12/2022   TRIG 91.0 06/12/2022   CHOLHDL 4 06/12/2022

## 2022-12-14 NOTE — Assessment & Plan Note (Addendum)
Untreated due to statin myalgia with trial of atorvastatin 40 mg every other day.   Sept 2023 Coronary  calcium score was 6 (LAD) . , statin therapy advised given concurrent T2DM    Trial of Zetia was recommended but deferred by patient until today

## 2022-12-15 LAB — LIPID PANEL W/REFLEX DIRECT LDL
Cholesterol: 277 mg/dL — ABNORMAL HIGH (ref <200)
HDL: 75 mg/dL (ref >=50)
LDL Cholesterol (Calc): 176 mg/dL (calc) — ABNORMAL HIGH
Non-HDL Cholesterol (Calc): 202 mg/dL (calc) — ABNORMAL HIGH (ref <130)
Total CHOL/HDL Ratio: 3.7 (calc) (ref <5.0)
Triglycerides: 127 mg/dL (ref <150)

## 2022-12-15 NOTE — Addendum Note (Signed)
Addended by: Sherlene Shams on: 12/15/2022 01:02 PM   Modules accepted: Orders

## 2023-01-05 ENCOUNTER — Encounter: Payer: Self-pay | Admitting: Internal Medicine

## 2023-01-11 ENCOUNTER — Other Ambulatory Visit: Payer: Medicare Other

## 2023-02-02 ENCOUNTER — Ambulatory Visit (INDEPENDENT_AMBULATORY_CARE_PROVIDER_SITE_OTHER): Payer: Medicare Other | Admitting: Dermatology

## 2023-02-02 VITALS — BP 114/72 | HR 65

## 2023-02-02 DIAGNOSIS — D225 Melanocytic nevi of trunk: Secondary | ICD-10-CM

## 2023-02-02 DIAGNOSIS — L649 Androgenic alopecia, unspecified: Secondary | ICD-10-CM

## 2023-02-02 DIAGNOSIS — L578 Other skin changes due to chronic exposure to nonionizing radiation: Secondary | ICD-10-CM | POA: Diagnosis not present

## 2023-02-02 DIAGNOSIS — L814 Other melanin hyperpigmentation: Secondary | ICD-10-CM | POA: Diagnosis not present

## 2023-02-02 DIAGNOSIS — Z1283 Encounter for screening for malignant neoplasm of skin: Secondary | ICD-10-CM

## 2023-02-02 DIAGNOSIS — L821 Other seborrheic keratosis: Secondary | ICD-10-CM

## 2023-02-02 DIAGNOSIS — Z79899 Other long term (current) drug therapy: Secondary | ICD-10-CM

## 2023-02-02 DIAGNOSIS — D1801 Hemangioma of skin and subcutaneous tissue: Secondary | ICD-10-CM

## 2023-02-02 DIAGNOSIS — D229 Melanocytic nevi, unspecified: Secondary | ICD-10-CM

## 2023-02-02 DIAGNOSIS — W908XXA Exposure to other nonionizing radiation, initial encounter: Secondary | ICD-10-CM

## 2023-02-02 DIAGNOSIS — L900 Lichen sclerosus et atrophicus: Secondary | ICD-10-CM | POA: Diagnosis not present

## 2023-02-02 NOTE — Patient Instructions (Signed)
Doses of minoxidil for hair loss are considered 'low dose'. This is because the doses used for hair loss are much lower than the doses which are used for conditions such as high blood pressure (hypertension). The doses used for hypertension are 10-40mg  per day.  Side effects are uncommon at the low doses (up to 2.5 mg/day) used to treat hair loss. Potential side effects, more commonly seen at higher doses, include: Increase in hair growth (hypertrichosis) elsewhere on face and body Temporary hair shedding upon starting medication which may last up to 4 weeks Ankle swelling, fluid retention, rapid weight gain more than 5 pounds Low blood pressure and feeling lightheaded or dizzy when standing up quickly Fast or irregular heartbeat Headaches   Due to recent changes in healthcare laws, you may see results of your pathology and/or laboratory studies on MyChart before the doctors have had a chance to review them. We understand that in some cases there may be results that are confusing or concerning to you. Please understand that not all results are received at the same time and often the doctors may need to interpret multiple results in order to provide you with the best plan of care or course of treatment. Therefore, we ask that you please give [place] 2 business days to thoroughly review all your results before contacting the office for clarification. Should we see a critical lab result, you will be contacted sooner.   If You Need Anything After Your Visit  If you have any questions or concerns for your doctor, please call our main line at [phone] and press option 4 to reach your doctor's medical assistant. If no one answers, please leave a voicemail as directed and we will return your call as soon as possible. Messages left after 4 pm will be answered the following business day.   You may also send [place] a message via MyChart. We typically respond to MyChart messages within 1-2 business days.  For  prescription refills, please ask your pharmacy to contact our office. Our fax number is [phone].  If you have an urgent issue when the clinic is closed that cannot wait until the next business day, you can page your doctor at the number below.    Please note that while we do our best to be available for urgent issues outside of office hours, we are not available 24/7.   If you have an urgent issue and are unable to reach [place], you may choose to seek medical care at your doctor's office, retail clinic, urgent care center, or emergency room.  If you have a medical emergency, please immediately call 911 or go to the emergency department.  Pager Numbers  - Dr. Gwen Pounds: [phone]  - Dr. Roseanne Reno: [phone]  - Dr. Katrinka Blazing: [phone]   In the event of inclement weather, please call our main line at [phone] for an update on the status of any delays or closures.  Dermatology Medication Tips: Please keep the boxes that topical medications come in in order to help keep track of the instructions about where and how to use these. Pharmacies typically print the medication instructions only on the boxes and not directly on the medication tubes.   If your medication is too expensive, please contact our office at [phone] option 4 or send [place] a message through MyChart.   We are unable to tell what your co-pay for medications will be in advance as this is different depending on your insurance coverage. However, we may be able  to find a substitute medication at lower cost or fill out paperwork to get insurance to cover a needed medication.   If a prior authorization is required to get your medication covered by your insurance company, please allow [place] 1-2 business days to complete this process.  Drug prices often vary depending on where the prescription is filled and some pharmacies may offer cheaper prices.  The website www.goodrx.com contains coupons for medications through different  pharmacies. The prices here do not account for what the cost may be with help from insurance (it may be cheaper with your insurance), but the website can give you the price if you did not use any insurance.  - You can print the associated coupon and take it with your prescription to the pharmacy.  - You may also stop by our office during regular business hours and pick up a GoodRx coupon card.  - If you need your prescription sent electronically to a different pharmacy, notify our office through [facility] or by phone at [phone] option 4.     Si Usted Necesita Algo Despus de Su Visita  Tambin puede enviarnos un mensaje a travs de Clinical cytogeneticist. Por lo general respondemos a los mensajes de MyChart en el transcurso de 1 a 2 das hbiles.  Para renovar recetas, por favor pida a su farmacia que se ponga en contacto con nuestra oficina. Annie Sable de fax es [place] [phone].  Si tiene un asunto urgente cuando la clnica est cerrada y que no puede esperar hasta el siguiente da hbil, puede llamar/localizar a su doctor(a) al nmero que aparece a continuacin.   Por favor, tenga en cuenta que aunque hacemos todo lo posible para estar disponibles para asuntos urgentes fuera del horario de [place], no estamos disponibles las 24 horas del da, los 7 [address]  [address] de la [place].   Si tiene un problema urgente y no puede comunicarse con nosotros, puede optar por buscar atencin mdica  en el consultorio de su doctor(a), en una clnica privada, en un centro de atencin urgente o en una sala de emergencias.  Si tiene Engineer, drilling, por favor llame inmediatamente al 911 o vaya a la sala de emergencias.  Nmeros de bper  - Dr. Gwen Pounds: [phone]  - Dra. Roseanne Reno: [phone]  - Dr. Katrinka Blazing: [phone]   En caso de inclemencias del tiempo, por favor llame a Lacy Duverney principal al [phone] para una actualizacin sobre el [place] de cualquier retraso o cierre.  Consejos para la  medicacin en dermatologa: Por favor, guarde las cajas en las que vienen los medicamentos de uso tpico para ayudarle a seguir las instrucciones sobre dnde y cmo usarlos. Las farmacias generalmente imprimen las instrucciones del medicamento slo en las cajas y no directamente en los tubos del [place].   Si su medicamento es muy caro, por favor, pngase en contacto con Rolm Gala llamando al [phone] y presione la opcin 4 o envenos un mensaje a travs de Clinical cytogeneticist.   No podemos decirle cul ser su copago por los medicamentos por adelantado ya que esto es diferente dependiendo de la cobertura de su seguro. Sin embargo, es posible que podamos encontrar un medicamento sustituto a Audiological scientist un formulario para que el seguro cubra el medicamento que se considera necesario.   Si se requiere una autorizacin previa para que su compaa de seguros [place] su medicamento, por favor permtanos de 1 a 2 das hbiles para completar [address]  Los precios de los medicamentos varan con  frecuencia dependiendo del lugar de dnde se surte la receta y alguna farmacias pueden ofrecer precios ms baratos.  El sitio web www.goodrx.com tiene cupones para medicamentos de Health and safety inspector. Los precios aqu no tienen en cuenta lo que podra costar con la ayuda del seguro (puede ser ms barato con su seguro), pero el sitio web puede darle el precio si no utiliz Tourist information centre manager.  - Puede imprimir el cupn correspondiente y llevarlo con su receta a la farmacia.  - Tambin puede pasar por nuestra oficina durante el horario de atencin regular y Education officer, museum una tarjeta de cupones de GoodRx.  - Si necesita que su receta se enve electrnicamente a una farmacia diferente, informe a nuestra oficina a travs de MyChart de [place] o por telfono llamando al [phone] y presione la opcin 4.

## 2023-02-02 NOTE — Progress Notes (Signed)
Follow-Up Visit   Subjective  Sheri Arnold is a 72 y.o. female who presents for the following: Skin Cancer Screening and Full Body Skin Exam  The patient presents for Total-Body Skin Exam (TBSE) for skin cancer screening and mole check. The patient has spots, moles and lesions to be evaluated, some may be new or changing.     The following portions of the chart were reviewed this encounter and updated as appropriate: medications, allergies, medical history  Review of Systems:  No other skin or systemic complaints except as noted in HPI or Assessment and Plan.  Objective  Well appearing patient in no apparent distress; mood and affect are within normal limits.  A full examination was performed including scalp, head, eyes, ears, nose, lips, neck, chest, axillae, abdomen, back, buttocks, bilateral upper extremities, bilateral lower extremities, hands, feet, fingers, toes, fingernails, and toenails. All findings within normal limits unless otherwise noted below.   Relevant physical exam findings are noted in the Assessment and Plan.    Assessment & Plan   SKIN CANCER SCREENING PERFORMED TODAY.  ACTINIC DAMAGE - Chronic condition, secondary to cumulative UV/sun exposure - diffuse scaly erythematous macules with underlying dyspigmentation - Recommend daily broad spectrum sunscreen SPF 30+ to sun-exposed areas, reapply every 2 hours as needed.  - Staying in the shade or wearing long sleeves, sun glasses (UVA+UVB protection) and wide brim hats (4-inch brim around the entire circumference of the hat) are also recommended for sun protection.  - Call for new or changing lesions.  LENTIGINES, SEBORRHEIC KERATOSES, HEMANGIOMAS - Benign normal skin lesions - Benign-appearing - Call for any changes  LENTIGINES Exam: scattered tan macules Due to sun exposure Treatment Plan: Benign-appearing, observe. Recommend daily broad spectrum sunscreen SPF 30+ to sun-exposed areas, reapply every 2  hours as needed.  Call for any changes  Counseling for BBL / IPL / Laser and Coordination of Care Discussed the treatment option of Broad Band Light (BBL) /Intense Pulsed Light (IPL)/ Laser for skin discoloration, including brown spots and redness.  Typically we recommend at least 1-3 treatment sessions about 5-8 weeks apart for best results.  Cannot have tanned skin when BBL performed, and regular use of sunscreen/photoprotection is advised after the procedure to help maintain results. The patient's condition may also require "maintenance treatments" in the future.  The fee for BBL / laser treatments is $350 per treatment session for the whole face.  A fee can be quoted for other parts of the body.  Insurance typically does not pay for BBL/laser treatments and therefore the fee is an out-of-pocket cost. Recommend prophylactic valtrex treatment. Once scheduled for procedure, will send Rx in prior to patient's appointment.   SEBORRHEIC KERATOSIS - 1.8 x 1.5cm waxy tan speckled patch of the crown scalp, photo from 06/30/21 compared - stable - Benign-appearing - Discussed benign etiology and prognosis. - Observe - Call for any changes   MELANOCYTIC NEVI - Tan-brown and/or pink-flesh-colored symmetric macules and papules - L spinal lower back  4.106mm medium brown macule   - Mid sternum  1.0cm fleshy brown papule    - L medial buttock  2.17mm med dark brown macule  - stable - Benign appearing on exam today - Observation - Call clinic for new or changing moles - Recommend daily use of broad spectrum spf 30+ sunscreen to sun-exposed areas.    ANDROGENETIC ALOPECIA (FEMALE PATTERN HAIR LOSS) Exam: Mild thinning hair of crown. Photo compared. Stable to slightly improved.   Chronic and  persistent condition with duration or expected duration over one year. Condition is improving with treatment but not currently at goal.   Female Androgenic Alopecia is a chronic condition related to genetics  and/or hormonal changes.  In women androgenetic alopecia is commonly associated with menopause but may occur any time after puberty.  It causes hair thinning primarily on the crown with widening of the part and temporal hairline recession.  Can use OTC Rogaine (minoxidil) 5% solution/foam as directed.  Oral treatments in female patients who have no contraindication may include : - Low dose oral minoxidil 1.25 - 5mg  daily - Spironolactone 50 - 100mg  bid - Finasteride 2.5 - 5 mg daily Adjunctive therapies include: - Low Level Laser Light Therapy (LLLT) - Platelet-rich plasma injections (PRP) - Hair Transplants or scalp reduction   Treatment Plan: Continue minoxidil 2.5 MG 1/2 tablet daily. Patient will call for refills.  Doses of minoxidil for hair loss are considered 'low dose'. This is because the doses used for hair loss are much lower than the doses which are used for conditions such as high blood pressure (hypertension). The doses used for hypertension are 10-40mg  per day.  Side effects are uncommon at the low doses (up to 2.5 mg/day) used to treat hair loss. Potential side effects, more commonly seen at higher doses, include: Increase in hair growth (hypertrichosis) elsewhere on face and body Temporary hair shedding upon starting medication which may last up to 4 weeks Ankle swelling, fluid retention, rapid weight gain more than 5 pounds Low blood pressure and feeling lightheaded or dizzy when standing up quickly Fast or irregular heartbeat Headaches  Long term medication management.  Patient is using long term (months to years) prescription medication  to control their dermatologic condition.  These medications require periodic monitoring to evaluate for efficacy and side effects and may require periodic laboratory monitoring.   LICHEN SCLEROSUS ET ATROPHICUS- Extragenital Biopsy proven 05/06/2022 Chronic and persistent condition with duration or expected duration over one year.  No  improvement but stable.  Has tried and failed Tacrolimus, Zoryve, Opzelura, Vtama. Not bothersome to patient, so prefers just to treat as needed with topical steroid.   Exam: White scaly atrophic macules and patches upper abdomen, mid abdomen, inframammary, right medial breast   Lichen sclerosus is a chronic inflammatory condition of unknown cause that frequently involves the vaginal area and less commonly extragenital skin, and is NOT sexually transmitted. It frequently causes symptoms of pain and burning.  It requires regular monitoring and treatment with topical steroids to minimize inflammation and to reduce risk of scarring. There is also a risk of cancer in the vaginal area which is very low if inflammation is well controlled. Regular checks of the area are recommended. Please call if you notice any new or changing spots within this area.  Treatment Plan: No treatment at this time. Not bothersome to patient.    Return in about 1 year (around 02/02/2024) for TBSE.  ICherlyn Labella, CMA, am acting as scribe for Willeen Niece, MD .   Documentation: I have reviewed the above documentation for accuracy and completeness, and I agree with the above.  Willeen Niece, MD

## 2023-03-01 ENCOUNTER — Encounter: Payer: Self-pay | Admitting: Internal Medicine

## 2023-03-01 ENCOUNTER — Ambulatory Visit (INDEPENDENT_AMBULATORY_CARE_PROVIDER_SITE_OTHER): Payer: Medicare Other | Admitting: Internal Medicine

## 2023-03-01 VITALS — BP 102/58 | HR 80 | Temp 98.4°F | Ht 65.0 in | Wt 203.2 lb

## 2023-03-01 DIAGNOSIS — Z7189 Other specified counseling: Secondary | ICD-10-CM | POA: Diagnosis not present

## 2023-03-01 DIAGNOSIS — E785 Hyperlipidemia, unspecified: Secondary | ICD-10-CM | POA: Diagnosis not present

## 2023-03-01 DIAGNOSIS — Z7984 Long term (current) use of oral hypoglycemic drugs: Secondary | ICD-10-CM

## 2023-03-01 DIAGNOSIS — E039 Hypothyroidism, unspecified: Secondary | ICD-10-CM | POA: Diagnosis not present

## 2023-03-01 DIAGNOSIS — Z66 Do not resuscitate: Secondary | ICD-10-CM | POA: Insufficient documentation

## 2023-03-01 DIAGNOSIS — E1169 Type 2 diabetes mellitus with other specified complication: Secondary | ICD-10-CM

## 2023-03-01 NOTE — Assessment & Plan Note (Signed)
During the course of the visit , End of Life objectives were discussed at length,  and patient has requested DNR status

## 2023-03-01 NOTE — Progress Notes (Signed)
Subjective:  Patient ID: Sheri Arnold, female    DOB: Mar 19, 1951  Age: 72 y.o. MRN: [id number]  CC: The primary encounter diagnosis was Do not resuscitate status. Diagnoses of Hypothyroidism, unspecified type, Type 2 diabetes mellitus with hyperlipidemia (HCC), and Advanced care planning/counseling discussion were also pertinent to this visit.   HPI Sheri Arnold presents for No chief complaint on file.  Patient is a 72 yr old female with type 2 DM/obesity /HTN who presents to discuss and request DNR order .  She is not depressed or angry.  She is socially active and belongs to a women's group comprised of women all 15 and older . She has worked with the dying and terminally ill and is adamant about not living long enough to "be a burden to anyone in ,her  family"  During the course of the visit , End of Life objectives were discussed at length,  Patient  has  a living will in place but is also interested in getting  MOST order signed.  She was given printed copy and encouraged to return after reviewing the choices. ,     Outpatient Medications Prior to Visit  Medication Sig Dispense Refill   betamethasone valerate (VALISONE) 0.1 % cream Apply topically daily. 45 g 1   levothyroxine (SYNTHROID) 75 MCG tablet TAKE 1 TABLET BY MOUTH EVERY DAY 90 tablet 1   metFORMIN (GLUCOPHAGE-XR) 500 MG 24 hr tablet TAKE 1 TABLET BY MOUTH EVERY DAY WITH BREAKFAST 90 tablet 1   minoxidil (LONITEN) 2.5 MG tablet TAKE 1 TABLET BY MOUTH EVERY DAY 90 tablet 1   mupirocin ointment (BACTROBAN) 2 % Apply 1 Application topically 2 (two) times daily. (Patient not taking: Reported on 03/01/2023) 22 g 0   No facility-administered medications prior to visit.    Review of Systems;  Patient denies headache, fevers, malaise, unintentional weight loss, skin rash, eye pain, sinus congestion and sinus pain, sore throat, dysphagia,  hemoptysis , cough, dyspnea, wheezing, chest pain, palpitations, orthopnea, edema, abdominal  pain, nausea, melena, diarrhea, constipation, flank pain, dysuria, hematuria, urinary  Frequency, nocturia, numbness, tingling, seizures,  Focal weakness, Loss of consciousness,  Tremor, insomnia, depression, anxiety, and suicidal ideation.      Objective:  BP (!) 102/58   Pulse 80   Temp 98.4 F (36.9 C) (Oral)   Ht 5\' 5"  (1.651 m)   Wt 203 lb 3.2 oz (92.2 kg)   SpO2 95%   BMI 33.81 kg/m   BP Readings from Last 3 Encounters:  03/01/23 (!) 102/58  02/02/23 114/72  12/14/22 118/70    Wt Readings from Last 3 Encounters:  03/01/23 203 lb 3.2 oz (92.2 kg)  12/14/22 201 lb 6.4 oz (91.4 kg)  10/09/22 204 lb 3.2 oz (92.6 kg)    Physical Exam Vitals reviewed.  Constitutional:      General: She is not in acute distress.    Appearance: Normal appearance. She is normal weight. She is not ill-appearing, toxic-appearing or diaphoretic.  HENT:     Head: Normocephalic.  Eyes:     General: No scleral icterus.       Right eye: No discharge.        Left eye: No discharge.     Conjunctiva/sclera: Conjunctivae normal.  Cardiovascular:     Rate and Rhythm: Normal rate and regular rhythm.     Heart sounds: Normal heart sounds.  Pulmonary:     Effort: Pulmonary effort is normal. No respiratory distress.  Breath sounds: Normal breath sounds.  Musculoskeletal:        General: Normal range of motion.  Skin:    General: Skin is warm and dry.  Neurological:     General: No focal deficit present.     Mental Status: She is alert and oriented to person, place, and time. Mental status is at baseline.  Psychiatric:        Mood and Affect: Mood normal.        Behavior: Behavior normal.        Thought Content: Thought content normal.        Judgment: Judgment normal.     Lab Results  Component Value Date   HGBA1C 6.1 (A) 12/14/2022   HGBA1C 6.8 (H) 06/12/2022   HGBA1C 6.6 (H) 09/08/2021    Lab Results  Component Value Date   CREATININE 0.70 12/14/2022   CREATININE 0.73  06/12/2022   CREATININE 0.71 10/30/2021    Lab Results  Component Value Date   WBC 4.9 06/12/2022   HGB 13.7 06/12/2022   HCT 41.6 06/12/2022   PLT 257.0 06/12/2022   GLUCOSE 119 (H) 12/14/2022   CHOL 277 (H) 12/14/2022   TRIG 127 12/14/2022   HDL 75 12/14/2022   LDLDIRECT 201.0 06/12/2022   LDLCALC 176 (H) 12/14/2022   ALT 19 12/14/2022   AST 19 12/14/2022   NA 138 12/14/2022   K 4.8 12/14/2022   CL 104 12/14/2022   CREATININE 0.70 12/14/2022   BUN 13 12/14/2022   CO2 25 12/14/2022   TSH 1.50 06/12/2022   HGBA1C 6.1 (A) 12/14/2022   MICROALBUR <0.7 06/12/2022    CT CARDIAC SCORING (SELF PAY ONLY)  Addendum Date: 03/23/2022   ADDENDUM REPORT: 03/23/2022 11:21 EXAM: OVER-READ INTERPRETATION  CT CHEST The following report is an over-read performed by radiologist Dr. Duanne Guess of [facility] on 03/23/2022. This over-read does not include interpretation of cardiac or coronary anatomy or pathology. The coronary calcium score interpretation by the cardiologist is attached. COMPARISON:  None. FINDINGS: Normal heart size. No pericardial effusion. Image thoracic aorta is nonaneurysmal. Central pulmonary vasculature within normal limits. No adenopathy within the included chest. Imaged lung fields are clear. Small-moderate-sized hiatal hernia. No acute bony or chest wall abnormality. IMPRESSION: 1. No significant extracardiac findings. 2. Small-moderate-sized hiatal hernia. Electronically Signed   By: Duanne Guess D.O.   On: 03/23/2022 11:21   Result Date: 03/23/2022 CLINICAL DATA:  Risk stratification EXAM: Coronary Calcium Score TECHNIQUE: The patient was scanned on a Siemens Somatom go.Top Scanner. Axial non-contrast 3 mm slices were carried out through the heart. The data set was analyzed on a dedicated work station and scored using the Agatson method. FINDINGS: Non-cardiac: See separate report from [facility]. Ascending Aorta: Normal size Pericardium:  Normal Coronary arteries: Normal origin of left and right coronary arteries. Distribution of arterial calcifications if present, as noted below; LM 0 LAD 5.96 LCx 0 RCA 0 Total 5.96 IMPRESSION AND RECOMMENDATION: 1. Coronary calcium score of 5.96. This was 47th percentile for age and sex matched control. 2. CAC 1-99 in LAD. CAC-DRS A1/N1. 3. Continue heart healthy lifestyle and risk factor modification. Electronically Signed: By: Debbe Odea M.D. On: 03/23/2022 10:51    Assessment & Plan:  .Do not resuscitate status Assessment & Plan: During the course of the visit , End of Life objectives were discussed at length,  and patient has requested DNR status   Orders: -     Do not attempt resuscitation (  DNR)  Hypothyroidism, unspecified type -     TSH; Future  Type 2 diabetes mellitus with hyperlipidemia (HCC) -     Hemoglobin A1c; Future -     Lipid panel; Future -     Microalbumin / creatinine urine ratio; Future  Advanced care planning/counseling discussion Assessment & Plan: During the course of the visit , End of Life objectives were discussed at length,       I provided 30 minutes of face-to-face time during this encounter reviewing patient's last visit with me, patient's  most recent visit with cardiology,  nephrology,  and neurology,  recent surgical and non surgical procedures, previous  labs and imaging studies, counseling on currently addressed issues,  and post visit ordering to diagnostics and therapeutics .   Follow-up: Return in about 3 months (around 06/01/2023) for follow up diabetes.   Sherlene Shams, MD

## 2023-03-01 NOTE — Assessment & Plan Note (Signed)
During the course of the visit , End of Life objectives were discussed at length,

## 2023-03-16 ENCOUNTER — Ambulatory Visit
Admission: RE | Admit: 2023-03-16 | Discharge: 2023-03-16 | Disposition: A | Discharge status: Home / Self Care | Payer: Medicare Other | Source: Ambulatory Visit | Attending: Internal Medicine | Admitting: Internal Medicine

## 2023-03-16 DIAGNOSIS — Z1231 Encounter for screening mammogram for malignant neoplasm of breast: Secondary | ICD-10-CM | POA: Diagnosis not present

## 2023-04-06 ENCOUNTER — Encounter: Payer: Self-pay | Admitting: Internal Medicine

## 2023-04-06 DIAGNOSIS — H40013 Open angle with borderline findings, low risk, bilateral: Secondary | ICD-10-CM | POA: Diagnosis not present

## 2023-04-06 DIAGNOSIS — H26493 Other secondary cataract, bilateral: Secondary | ICD-10-CM | POA: Diagnosis not present

## 2023-04-06 LAB — HM DIABETES EYE EXAM

## 2023-04-08 DIAGNOSIS — H02831 Dermatochalasis of right upper eyelid: Secondary | ICD-10-CM | POA: Diagnosis not present

## 2023-04-08 DIAGNOSIS — H40013 Open angle with borderline findings, low risk, bilateral: Secondary | ICD-10-CM | POA: Diagnosis not present

## 2023-04-08 DIAGNOSIS — H26493 Other secondary cataract, bilateral: Secondary | ICD-10-CM | POA: Diagnosis not present

## 2023-04-08 DIAGNOSIS — E119 Type 2 diabetes mellitus without complications: Secondary | ICD-10-CM | POA: Diagnosis not present

## 2023-04-08 DIAGNOSIS — H02834 Dermatochalasis of left upper eyelid: Secondary | ICD-10-CM | POA: Diagnosis not present

## 2023-04-19 ENCOUNTER — Telehealth: Payer: Self-pay | Admitting: Internal Medicine

## 2023-04-19 NOTE — Telephone Encounter (Signed)
Placed in red folder for completion

## 2023-04-19 NOTE — Telephone Encounter (Signed)
Patient dropped off a [employer] and [employer] clearance form. Form is up front in Dr Melina Schools color folder.

## 2023-04-21 NOTE — Telephone Encounter (Signed)
noted 

## 2023-04-21 NOTE — Telephone Encounter (Signed)
LMTCB. Need to let pt know that the form has been completed and faxed.

## 2023-04-21 NOTE — Telephone Encounter (Signed)
Patient states she is returning our call.  I read message from Sandy Salaam, CMA, to patient.

## 2023-05-29 ENCOUNTER — Other Ambulatory Visit: Payer: Self-pay | Admitting: Internal Medicine

## 2023-06-16 ENCOUNTER — Ambulatory Visit: Payer: Medicare Other | Admitting: Internal Medicine

## 2023-06-16 ENCOUNTER — Encounter: Payer: Self-pay | Admitting: Internal Medicine

## 2023-06-16 VITALS — BP 120/68 | HR 67 | Temp 98.2°F | Resp 16 | Ht 65.0 in | Wt 205.4 lb

## 2023-06-16 DIAGNOSIS — D126 Benign neoplasm of colon, unspecified: Secondary | ICD-10-CM

## 2023-06-16 DIAGNOSIS — E785 Hyperlipidemia, unspecified: Secondary | ICD-10-CM | POA: Diagnosis not present

## 2023-06-16 DIAGNOSIS — E782 Mixed hyperlipidemia: Secondary | ICD-10-CM

## 2023-06-16 DIAGNOSIS — E1169 Type 2 diabetes mellitus with other specified complication: Secondary | ICD-10-CM

## 2023-06-16 DIAGNOSIS — Z7985 Long-term (current) use of injectable non-insulin antidiabetic drugs: Secondary | ICD-10-CM | POA: Diagnosis not present

## 2023-06-16 DIAGNOSIS — E039 Hypothyroidism, unspecified: Secondary | ICD-10-CM

## 2023-06-16 LAB — COMPREHENSIVE METABOLIC PANEL
ALT: 21 U/L (ref 0–35)
AST: 19 U/L (ref 0–37)
Albumin: 4.3 g/dL (ref 3.5–5.2)
Alkaline Phosphatase: 64 U/L (ref 39–117)
BUN: 12 mg/dL (ref 6–23)
CO2: 27 meq/L (ref 19–32)
Calcium: 9.6 mg/dL (ref 8.4–10.5)
Chloride: 102 meq/L (ref 96–112)
Creatinine, Ser: 0.66 mg/dL (ref 0.40–1.20)
GFR: 88.92 mL/min (ref >60.00)
Glucose, Bld: 115 mg/dL — ABNORMAL HIGH (ref 70–99)
Potassium: 4.4 meq/L (ref 3.5–5.1)
Sodium: 136 meq/L (ref 135–145)
Total Bilirubin: 0.4 mg/dL (ref 0.2–1.2)
Total Protein: 7.6 g/dL (ref 6.0–8.3)

## 2023-06-16 LAB — HEMOGLOBIN A1C: Hgb A1c MFr Bld: 6.8 % — ABNORMAL HIGH (ref 4.6–6.5)

## 2023-06-16 LAB — TSH: TSH: 1.84 u[IU]/mL (ref 0.35–5.50)

## 2023-06-16 LAB — MICROALBUMIN / CREATININE URINE RATIO
Creatinine,U: 36.3 mg/dL
Microalb Creat Ratio: 1.9 mg/g (ref 0.0–30.0)
Microalb, Ur: <0.7 mg/dL (ref 0.0–1.9)

## 2023-06-16 NOTE — Progress Notes (Signed)
Patient ID: Sheri Arnold, female    DOB: [birth year]  Age: 73 y.o. MRN: [id number]  The patient is here for follow up and  management of other chronic and acute problems.   The risk factors are reflected in the social history.  The roster of all physicians providing medical care to patient - is listed in the Snapshot section of the chart.  Activities of daily living:  The patient is 100% independent in all ADLs: dressing, toileting, feeding as well as independent mobility  Home safety : The patient has smoke detectors in the home. They wear seatbelts.  There are no firearms at home. There is no violence in the home.   There is no risks for hepatitis, STDs or HIV. There is no   history of blood transfusion. They have no travel history to infectious disease endemic areas of the world.  The patient has seen their dentist in the last six month. They have seen their eye doctor in the last year. They admit to slight hearing difficulty with regard to whispered voices and some television programs.  They have deferred audiologic testing in the last year.  They do not  have excessive sun exposure. Discussed the need for sun protection: hats, long sleeves and use of sunscreen if there is significant sun exposure.   Diet: the importance of a healthy diet is discussed. They do have a healthy diet.  The benefits of regular aerobic exercise were discussed. Sheri Arnold walks 4 times per week ,  20 minutes.   Depression screen: there are no signs or vegative symptoms of depression- irritability, change in appetite, anhedonia, sadness/tearfullness.  Cognitive assessment: the patient manages all their financial and personal affairs and is actively engaged. They could relate day,date,year and events; recalled 2/3 objects at 3 minutes; performed clock-face test normally.  The following portions of the patient's history were reviewed and updated as appropriate: allergies, current medications, past family history, past  medical history,  past surgical history, past social history  and problem list.  Visual acuity was not assessed per patient preference since Sheri Arnold has regular follow up with Sheri Arnold ophthalmologist. Hearing and body mass index were assessed and reviewed.   During the course of the visit the patient was educated and counseled about appropriate screening and preventive services including : fall prevention , diabetes screening, nutrition counseling, colorectal cancer screening, and recommended immunizations.    CC: The primary encounter diagnosis was Mixed hyperlipidemia. Diagnoses of Type 2 diabetes mellitus with hyperlipidemia (HCC), Hypothyroidism, unspecified type, and Tubular adenoma of colon were also pertinent to this visit.  History Sheri Arnold has a past medical history of dysplastic nevus (08/22/2010), Hyperlipidemia, and Hypothyroidism.   Sheri Arnold has a past surgical history that includes Eye surgery; Tummy Tuck (2010); Colonoscopy; Colonoscopy with propofol (N/A, 06/12/2019); and cataract surgery (Bilateral, 11/03/2021).   Sheri Arnold family history includes Breast cancer in Sheri Arnold cousin and maternal aunt; Diabetes in Sheri Arnold mother; Heart disease in Sheri Arnold father; Hyperlipidemia in Sheri Arnold brother and sister.Sheri Arnold reports that Sheri Arnold has never smoked. Sheri Arnold has never used smokeless tobacco. Sheri Arnold reports current alcohol use of about 7.0 standard drinks of alcohol per week. Sheri Arnold reports that Sheri Arnold does not use drugs.  Outpatient Medications Prior to Visit  Medication Sig Dispense Refill   betamethasone valerate (VALISONE) 0.1 % cream Apply topically daily. 45 g 1   levothyroxine (SYNTHROID) 75 MCG tablet TAKE 1 TABLET BY MOUTH EVERY DAY 90 tablet 1   metFORMIN (GLUCOPHAGE-XR) 500 MG 24 hr tablet TAKE 1  TABLET BY MOUTH EVERY DAY WITH BREAKFAST 90 tablet 1   minoxidil (LONITEN) 2.5 MG tablet TAKE 1 TABLET BY MOUTH EVERY DAY 90 tablet 1   No facility-administered medications prior to visit.    Review of Systems  Patient denies  headache, fevers, malaise, unintentional weight loss, skin rash, eye pain, sinus congestion and sinus pain, sore throat, dysphagia,  hemoptysis , cough, dyspnea, wheezing, chest pain, palpitations, orthopnea, edema, abdominal pain, nausea, melena, diarrhea, constipation, flank pain, dysuria, hematuria, urinary  Frequency, nocturia, numbness, tingling, seizures,  Focal weakness, Loss of consciousness,  Tremor, insomnia, depression, anxiety, and suicidal ideation.     Objective:  BP 120/68   Pulse 67   Temp 98.2 F (36.8 C)   Resp 16   Ht 5\' 5"  (1.651 m)   Wt 205 lb 6.4 oz (93.2 kg)   SpO2 98%   BMI 34.18 kg/m   Physical Exam Vitals reviewed.  Constitutional:      General: Sheri Arnold is not in acute distress.    Appearance: Normal appearance. Sheri Arnold is normal weight. Sheri Arnold is not ill-appearing, toxic-appearing or diaphoretic.  HENT:     Head: Normocephalic.  Eyes:     General: No scleral icterus.       Right eye: No discharge.        Left eye: No discharge.     Conjunctiva/sclera: Conjunctivae normal.  Cardiovascular:     Rate and Rhythm: Normal rate and regular rhythm.     Heart sounds: Normal heart sounds.  Pulmonary:     Effort: Pulmonary effort is normal. No respiratory distress.     Breath sounds: Normal breath sounds.  Musculoskeletal:        General: Normal range of motion.  Skin:    General: Skin is warm and dry.  Neurological:     General: No focal deficit present.     Mental Status: Sheri Arnold is alert and oriented to person, place, and time. Mental status is at baseline.  Psychiatric:        Mood and Affect: Mood normal.        Behavior: Behavior normal.        Thought Content: Thought content normal.        Judgment: Judgment normal.    Assessment & Plan:  Mixed hyperlipidemia Assessment & Plan: Managed without statin   due to statin myalgia with trial of atorvastatin 40 mg every other day.   Sept 2023 Coronary  calcium score was 6 (LAD) . , statin therapy advised given  concurrent T2DM    Trial of Zetia was not tolerated    Type 2 diabetes mellitus with hyperlipidemia [facility]) Assessment & Plan: With obesity , hyperlipidemia and coronary atherosclerosis noted on Cardiac CT  BUT A LOW CALCIUM SOCRE OF 6.  .Reviewed diagnosis,made with a fasting glucose of 127, A1c of 6.6 , and Sheri Arnold has lowered Sheri Arnold A1c  in the past to  6.1 Sheri Arnold is exercising at the gym using silver sneakers and swimming daily .   Zetia trial not tolerated due to side effects.l  (Sheri Arnold has a history of statin myalgia). Sheri Arnold has no contraindications to GLP 1 agonist therapy.  Will discuss    Lab Results  Component Value Date   HGBA1C 6.8 (H) 06/16/2023   Lab Results  Component Value Date   LABMICR See below: 02/22/2018   LABMICR See below: 06/03/2017   MICROALBUR <0.7 06/16/2023   MICROALBUR <0.7 06/12/2022      Lab Results  Component Value  Date   CHOL 277 (H) 12/14/2022   HDL 75 12/14/2022   LDLCALC 176 (H) 12/14/2022   LDLDIRECT 201.0 06/12/2022   TRIG 127 12/14/2022   CHOLHDL 3.7 12/14/2022     Orders: -     Microalbumin / creatinine urine ratio -     Hemoglobin A1c -     Comprehensive metabolic panel -     Lipid Panel w/reflex Direct LDL  Hypothyroidism, unspecified type -     TSH  Tubular adenoma of colon Assessment & Plan: Sheri Arnold is overdue for colonoscopy intentionally;  I have strongly recommended continued follow up due the presence of 4 /6 polyps being TA's . Referral accepted to Dr Allegra Lai   Orders: -     Ambulatory referral to Gastroenterology      I provided 30 minutes of  face-to-face time during this encounter reviewing patient's current problems and past surgeries,  recent labs and imaging studies, providing counseling on the above mentioned problems , and coordination  of care .   Follow-up: No follow-ups on file.   Sherlene Shams, MD

## 2023-06-16 NOTE — Patient Instructions (Signed)
COLONOSCOPY REFERRAL IS IN PROGRESS TO DR Allegra Lai  TRY FLAVORING THE YUKKY COLON PREP WITH CRYSTAL LIGHT, POWERADE  ETC (JUST NOT RED)  I highly recommend reading "the End of Alzheimer's: The First Program to Prevent and Reverse Cognitive Decline"  by Dorice Lamas, MD

## 2023-06-16 NOTE — Assessment & Plan Note (Signed)
She is overdue for colonoscopy intentionally;  I have strongly recommended continued follow up due the presence of 4 /6 polyps being TA's . Referral accepted to Dr Allegra Lai

## 2023-06-16 NOTE — Assessment & Plan Note (Addendum)
With obesity , hyperlipidemia and coronary atherosclerosis noted on Cardiac CT  BUT A LOW CALCIUM SOCRE OF 6.  .Reviewed diagnosis,made with a fasting glucose of 127, A1c of 6.6 , and she has lowered her A1c  in the past to  6.1 She is exercising at the gym using silver sneakers and swimming daily .   Zetia trial not tolerated due to side effects.l  (she has a history of statin myalgia). She has no contraindications to GLP 1 agonist therapy.  Will discuss    Lab Results  Component Value Date   HGBA1C 6.8 (H) 06/16/2023   Lab Results  Component Value Date   LABMICR See below: 02/22/2018   LABMICR See below: 06/03/2017   MICROALBUR <0.7 06/16/2023   MICROALBUR <0.7 06/12/2022      Lab Results  Component Value Date   CHOL 277 (H) 12/14/2022   HDL 75 12/14/2022   LDLCALC 176 (H) 12/14/2022   LDLDIRECT 201.0 06/12/2022   TRIG 127 12/14/2022   CHOLHDL 3.7 12/14/2022

## 2023-06-16 NOTE — Assessment & Plan Note (Signed)
Managed without statin   due to statin myalgia with trial of atorvastatin 40 mg every other day.   Sept 2023 Coronary  calcium score was 6 (LAD) . , statin therapy advised given concurrent T2DM    Trial of Zetia was not tolerated

## 2023-06-17 ENCOUNTER — Telehealth: Payer: Self-pay

## 2023-06-17 ENCOUNTER — Other Ambulatory Visit: Payer: Self-pay

## 2023-06-17 DIAGNOSIS — Z860100 Personal history of colon polyps, unspecified: Secondary | ICD-10-CM

## 2023-06-17 LAB — LIPID PANEL W/REFLEX DIRECT LDL
Cholesterol: 289 mg/dL — ABNORMAL HIGH (ref <200)
HDL: 76 mg/dL (ref >=50)
LDL Cholesterol (Calc): 189 mg/dL — ABNORMAL HIGH
Non-HDL Cholesterol (Calc): 213 mg/dL — ABNORMAL HIGH (ref <130)
Total CHOL/HDL Ratio: 3.8 (calc) (ref <5.0)
Triglycerides: 116 mg/dL (ref <150)

## 2023-06-17 MED ORDER — NA SULFATE-K SULFATE-MG SULF 17.5-3.13-1.6 GM/177ML PO SOLN: 1.0000 | Freq: Once | ORAL | 0 refills | Status: AC

## 2023-06-17 NOTE — Telephone Encounter (Signed)
Gastroenterology Pre-Procedure Review  Request Date: 07/08/23 Requesting Physician: Dr. Allegra Lai  PATIENT REVIEW QUESTIONS: The patient responded to the following health history questions as indicated:    1. Are you having any GI issues? no 2. Do you have a personal history of Polyps? yes (last colonoscopy performed by Dr. Allegra Lai 06/12/19) 3. Do you have a family history of Colon Cancer or Polyps? no 4. Diabetes Mellitus? yes (has been advised to stop metformin 2 days prior to colonoscopy) 5. Joint replacements in the past 12 months?no 6. Major health problems in the past 3 months?no 7. Any artificial heart valves, MVP, or defibrillator?no    MEDICATIONS & ALLERGIES:    Patient reports the following regarding taking any anticoagulation/antiplatelet therapy:   Plavix, Coumadin, Eliquis, Xarelto, Lovenox, Pradaxa, Brilinta, or Effient? no Aspirin? no  Patient confirms/reports the following medications:  Current Outpatient Medications  Medication Sig Dispense Refill   betamethasone valerate (VALISONE) 0.1 % cream Apply topically daily. 45 g 1   levothyroxine (SYNTHROID) 75 MCG tablet TAKE 1 TABLET BY MOUTH EVERY DAY 90 tablet 1   metFORMIN (GLUCOPHAGE-XR) 500 MG 24 hr tablet TAKE 1 TABLET BY MOUTH EVERY DAY WITH BREAKFAST 90 tablet 1   minoxidil (LONITEN) 2.5 MG tablet TAKE 1 TABLET BY MOUTH EVERY DAY 90 tablet 1   No current facility-administered medications for this visit.    Patient confirms/reports the following allergies:  Allergies  Allergen Reactions   Atorvastatin Other (See Comments)    Muscle pain    Statins    Zetia [Ezetimibe]     Muscle pain    Epinephrine Anxiety    No orders of the defined types were placed in this encounter.   AUTHORIZATION INFORMATION Primary Insurance: 1D#: Group #:  Secondary Insurance: 1D#: Group #:  SCHEDULE INFORMATION: Date: 07/08/23 Time: Location: [facility]

## 2023-07-07 ENCOUNTER — Encounter: Payer: Self-pay | Admitting: Gastroenterology

## 2023-07-08 ENCOUNTER — Ambulatory Visit
Admission: RE | Admit: 2023-07-08 | Discharge: 2023-07-08 | Disposition: A | Discharge status: Home / Self Care | Payer: Medicare Other | Attending: Gastroenterology | Admitting: Gastroenterology

## 2023-07-08 ENCOUNTER — Encounter: Payer: Self-pay | Admitting: Gastroenterology

## 2023-07-08 ENCOUNTER — Ambulatory Visit: Payer: Medicare Other | Admitting: Anesthesiology

## 2023-07-08 ENCOUNTER — Encounter
Admission: RE | Disposition: A | Discharge status: Home / Self Care | Payer: Self-pay | Source: Home / Self Care | Attending: Gastroenterology

## 2023-07-08 DIAGNOSIS — Z7984 Long term (current) use of oral hypoglycemic drugs: Secondary | ICD-10-CM | POA: Diagnosis not present

## 2023-07-08 DIAGNOSIS — Z860101 Personal history of adenomatous and serrated colon polyps: Secondary | ICD-10-CM

## 2023-07-08 DIAGNOSIS — E039 Hypothyroidism, unspecified: Secondary | ICD-10-CM | POA: Diagnosis not present

## 2023-07-08 DIAGNOSIS — E119 Type 2 diabetes mellitus without complications: Secondary | ICD-10-CM | POA: Insufficient documentation

## 2023-07-08 DIAGNOSIS — Z860100 Personal history of colon polyps, unspecified: Secondary | ICD-10-CM

## 2023-07-08 DIAGNOSIS — Z1211 Encounter for screening for malignant neoplasm of colon: Secondary | ICD-10-CM | POA: Diagnosis not present

## 2023-07-08 DIAGNOSIS — Z833 Family history of diabetes mellitus: Secondary | ICD-10-CM | POA: Diagnosis not present

## 2023-07-08 DIAGNOSIS — K562 Volvulus: Secondary | ICD-10-CM

## 2023-07-08 HISTORY — DX: Type 2 diabetes mellitus without complications: E11.9

## 2023-07-08 HISTORY — PX: COLONOSCOPY WITH PROPOFOL: SHX5780

## 2023-07-08 SURGERY — COLONOSCOPY WITH PROPOFOL
Anesthesia: General

## 2023-07-08 MED ORDER — SODIUM CHLORIDE 0.9% FLUSH: 3.0000 mL | Freq: Two times a day (BID) | INTRAVENOUS | Status: DC

## 2023-07-08 MED ORDER — SODIUM CHLORIDE 0.9 % IV SOLN: INTRAVENOUS | Status: DC

## 2023-07-08 MED ORDER — PROPOFOL 1000 MG/100ML IV EMUL
INTRAVENOUS | Status: AC
  Filled 2023-07-08: qty 200

## 2023-07-08 MED ORDER — EPHEDRINE SULFATE (PRESSORS) 50 MG/ML IJ SOLN
INTRAMUSCULAR | Status: DC | PRN
  Administered 2023-07-08: 10 mg via INTRAVENOUS

## 2023-07-08 MED ORDER — STERILE WATER FOR IRRIGATION IR SOLN
Status: DC | PRN
  Administered 2023-07-08: 50 mL

## 2023-07-08 MED ORDER — PROPOFOL 500 MG/50ML IV EMUL
INTRAVENOUS | Status: DC | PRN
  Administered 2023-07-08: 140 ug/kg/min via INTRAVENOUS

## 2023-07-08 MED ORDER — PROPOFOL 10 MG/ML IV BOLUS
INTRAVENOUS | Status: DC | PRN
  Administered 2023-07-08: 70 mg via INTRAVENOUS

## 2023-07-08 MED ORDER — PHENYLEPHRINE 80 MCG/ML (10ML) SYRINGE FOR IV PUSH (FOR BLOOD PRESSURE SUPPORT)
PREFILLED_SYRINGE | INTRAVENOUS | Status: AC
  Filled 2023-07-08: qty 10

## 2023-07-08 MED ORDER — SODIUM CHLORIDE 0.9% FLUSH
3.0000 mL | INTRAVENOUS | Status: DC | PRN
Start: 2023-07-08 — End: 2023-07-08

## 2023-07-08 NOTE — Anesthesia Postprocedure Evaluation (Signed)
Anesthesia Post Note  Patient: Sheri Arnold  Procedure(s) Performed: COLONOSCOPY WITH PROPOFOL  Patient location during evaluation: Endoscopy Anesthesia Type: General Level of consciousness: awake and alert Pain management: pain level controlled Vital Signs Assessment: post-procedure vital signs reviewed and stable Respiratory status: spontaneous breathing, nonlabored ventilation and respiratory function stable Cardiovascular status: blood pressure returned to baseline and stable Postop Assessment: no apparent nausea or vomiting Anesthetic complications: no   No notable events documented.   Last Vitals:  Vitals:   07/08/23 1200 07/08/23 1220  BP: (!) 141/58 (!) 107/59  Pulse: 76   Resp: 16 18  Temp: (!) 36.1 C   SpO2: 100% 100%    Last Pain:  Vitals:   07/08/23 1220  TempSrc:   PainSc: 0-No pain                 Foye Deer

## 2023-07-08 NOTE — Op Note (Signed)
[facility] Patient Name: Sheri Arnold Procedure Date: 07/08/2023 11:43 AM MRN: [id number] Account #: [id number] Date of Birth: Jun 25, 1950 Admit Type: Outpatient Age: 73 Room: [facility] ENDO ROOM 3 Gender: Female Note Status: Finalized Instrument Name: Prentice Docker [id number] Procedure:             Colonoscopy Indications:           Surveillance: Personal history of adenomatous polyps                         on last colonoscopy 3 years ago Providers:             Toney Reil MD, MD Referring MD:          Duncan Dull, MD (Referring MD) Medicines:             General Anesthesia Complications:         No immediate complications. Estimated blood loss: None. Procedure:             Pre-Anesthesia Assessment:                        - Prior to the procedure, a History and Physical was                         performed, and patient medications and allergies were                         reviewed. The patient is competent. The risks and                         benefits of the procedure and the sedation options and                         risks were discussed with the patient. All questions                         were answered and informed consent was obtained.                         Patient identification and proposed procedure were                         verified by the physician, the nurse, the                         anesthesiologist, the anesthetist and the technician                         in the pre-procedure area in the procedure room in the                         endoscopy suite. Mental Status Examination: alert and                         oriented. Airway Examination: normal oropharyngeal                         airway and neck mobility. Respiratory Examination:  clear to auscultation. CV Examination: normal.                         Prophylactic Antibiotics: The patient does not require                         prophylactic  antibiotics. Prior Anticoagulants: The                         patient has taken no anticoagulant or antiplatelet                         agents. ASA Grade Assessment: II - A patient with mild                         systemic disease. After reviewing the risks and                         benefits, the patient was deemed in satisfactory                         condition to undergo the procedure. The anesthesia                         plan was to use general anesthesia. Immediately prior                         to administration of medications, the patient was                         re-assessed for adequacy to receive sedatives. The                         heart rate, respiratory rate, oxygen saturations,                         blood pressure, adequacy of pulmonary ventilation, and                         response to care were monitored throughout the                         procedure. The physical status of the patient was                         re-assessed after the procedure.                        After obtaining informed consent, the colonoscope was                         passed under direct vision. Throughout the procedure,                         the patient's blood pressure, pulse, and oxygen                         saturations were monitored continuously. The  Colonoscope was introduced through the anus and                         advanced to the the cecum, identified by appendiceal                         orifice and ileocecal valve. The colonoscopy was                         performed with moderate difficulty due to significant                         looping and the patient's body habitus. Successful                         completion of the procedure was aided by applying                         abdominal pressure. The patient tolerated the                         procedure well. The quality of the bowel preparation                         was  evaluated using the BBPS [facility] Bowel Preparation                         Scale) with scores of: Right Colon = 3, Transverse                         Colon = 3 and Left Colon = 3 (entire mucosa seen well                         with no residual staining, small fragments of stool or                         opaque liquid). The total BBPS score equals 9. The                         ileocecal valve, appendiceal orifice, and rectum were                         photographed. Findings:      The perianal and digital rectal examinations were normal. Pertinent       negatives include normal sphincter tone and no palpable rectal lesions.      The colon (entire examined portion) appeared normal.      The retroflexed view of the distal rectum and anal verge was normal and       showed no anal or rectal abnormalities. Impression:            - The entire examined colon is normal.                        - The distal rectum and anal verge are normal on                         retroflexion  view.                        - No specimens collected. Recommendation:        - Discharge patient to home (with escort).                        - Resume previous diet today.                        - Continue present medications.                        - Repeat colonoscopy in 5 years for surveillance. Procedure Code(s):     --- Professional ---                        W0981, Colorectal cancer screening; colonoscopy on                         individual at high risk Diagnosis Code(s):     --- Professional ---                        Z86.010, Personal history of colonic polyps CPT copyright 2022 American Medical Association. All rights reserved. The codes documented in this report are preliminary and upon coder review may  be revised to meet current compliance requirements. Dr. Libby Maw Toney Reil MD, MD 07/08/2023 11:59:31 AM This report has been signed electronically. Number of Addenda: 0 Note Initiated On:  07/08/2023 11:43 AM Scope Withdrawal Time: 0 hours 7 minutes 27 seconds  Total Procedure Duration: 0 hours 10 minutes 20 seconds  Estimated Blood Loss:  Estimated blood loss: none.      [facility]

## 2023-07-08 NOTE — Anesthesia Preprocedure Evaluation (Signed)
Anesthesia Evaluation  Patient identified by MRN, date of birth, ID band Patient awake    Reviewed: Allergy & Precautions, H&P , NPO status , Patient's Chart, lab work & pertinent test results  Airway Mallampati: II  TM Distance: >3 FB Neck ROM: full    Dental no notable dental hx.    Pulmonary neg pulmonary ROS   Pulmonary exam normal        Cardiovascular negative cardio ROS Normal cardiovascular exam     Neuro/Psych negative neurological ROS  negative psych ROS   GI/Hepatic negative GI ROS, Neg liver ROS,,,  Endo/Other  diabetes, Type 2Hypothyroidism    Renal/GU negative Renal ROS  negative genitourinary   Musculoskeletal   Abdominal   Peds  Hematology negative hematology ROS (+)   Anesthesia Other Findings Past Medical History: 08/22/2010: Hx of dysplastic nevus     Comment:  mid abdomen No date: Hyperlipidemia No date: Hypothyroidism  Past Surgical History: 11/03/2021: cataract surgery; Bilateral No date: COLONOSCOPY 06/12/2019: COLONOSCOPY WITH PROPOFOL; N/A     Comment:  Procedure: COLONOSCOPY WITH PROPOFOL;  Surgeon: Toney Reil, MD;  Location: [facility] ENDOSCOPY;  Service:               Gastroenterology;  Laterality: N/A; No date: EYE SURGERY 2010: Tummy Tuck     Reproductive/Obstetrics negative OB ROS                              Anesthesia Physical Anesthesia Plan  ASA: 2  Anesthesia Plan: General   Post-op Pain Management:    Induction: Intravenous  PONV Risk Score and Plan: Propofol infusion and TIVA  Airway Management Planned: Natural Airway  Additional Equipment:   Intra-op Plan:   Post-operative Plan:   Informed Consent:      Dental Advisory Given  Plan Discussed with: CRNA and Surgeon  Anesthesia Plan Comments:          Anesthesia Quick Evaluation

## 2023-07-08 NOTE — H&P (Signed)
Sheri Repress, MD [address]  [address]  [place], [place] [address]  Main: [phone]  Fax: [phone] Pager: [phone]  Primary Care Physician:  Sheri Shams, MD Primary Gastroenterologist:  Dr. Arlyss Arnold  Pre-Procedure History & Physical: HPI:  Sheri Arnold is a 73 y.o. female is here for an colonoscopy.   Past Medical History:  Diagnosis Date   Diabetes mellitus without complication (HCC)    Hx of dysplastic nevus 08/22/2010   mid abdomen   Hyperlipidemia    Hypothyroidism     Past Surgical History:  Procedure Laterality Date   cataract surgery Bilateral 11/03/2021   COLONOSCOPY     COLONOSCOPY WITH PROPOFOL N/A 06/12/2019   Procedure: COLONOSCOPY WITH PROPOFOL;  Surgeon: Sheri Reil, MD;  Location: [facility] ENDOSCOPY;  Service: Gastroenterology;  Laterality: N/A;   EYE SURGERY     Tummy Tuck  2010    Prior to Admission medications   Medication Sig Start Date End Date Taking? Authorizing Provider  levothyroxine (SYNTHROID) 75 MCG tablet TAKE 1 TABLET BY MOUTH EVERY DAY 11/03/22  Yes Sheri Shams, MD  betamethasone valerate (VALISONE) 0.1 % cream Apply topically daily. 10/20/22   Sheri Shams, MD  metFORMIN (GLUCOPHAGE-XR) 500 MG 24 hr tablet TAKE 1 TABLET BY MOUTH EVERY DAY WITH BREAKFAST 05/31/23   Sheri Shams, MD  minoxidil (LONITEN) 2.5 MG tablet TAKE 1 TABLET BY MOUTH EVERY DAY 12/07/22   Willeen Niece, MD    Allergies as of 06/17/2023 - Review Complete 06/17/2023  Allergen Reaction Noted   Atorvastatin Other (See Comments) 01/06/2023   Statins  01/06/2023   Zetia [ezetimibe]  01/06/2023   Epinephrine Anxiety 01/14/2021    Family History  Problem Relation Age of Onset   Diabetes Mother    Heart disease Father    Breast cancer Maternal Aunt    Hyperlipidemia Sister    Hyperlipidemia Brother    Breast cancer Cousin     Social History   Socioeconomic History   Marital status: Widowed    Spouse name: Not on file    Number of children: Not on file   Years of education: Not on file   Highest education level: Bachelor's degree (e.g., BA, AB, BS)  Occupational History   Not on file  Tobacco Use   Smoking status: Never   Smokeless tobacco: Never  Vaping Use   Vaping status: Never Used  Substance and Sexual Activity   Alcohol use: Yes    Alcohol/week: 7.0 standard drinks of alcohol    Types: 7 Shots of liquor per week   Drug use: No   Sexual activity: Not on file  Other Topics Concern   Not on file  Social History Narrative   Not on file   Social Drivers of Health   Financial Resource Strain: Low Risk  (06/16/2023)   Overall Financial Resource Strain (CARDIA)    Difficulty of Paying Living Expenses: Not hard at all  Food Insecurity: No Food Insecurity (06/16/2023)   Hunger Vital Sign    Worried About Running Out of Food in the Last Year: Never true    Ran Out of Food in the Last Year: Never true  Transportation Needs: No Transportation Needs (06/16/2023)   PRAPARE - Administrator, Civil Service (Medical): No    Lack of Transportation (Non-Medical): No  Physical Activity: Insufficiently Active (06/16/2023)   Exercise Vital Sign    Days of Exercise per Week: 3 days  Minutes of Exercise per Session: 20 min  Stress: No Stress Concern Present (06/16/2023)   [employer] of Occupational Health - Occupational Stress Questionnaire    Feeling of Stress : Not at all  Social Connections: Moderately Integrated (06/16/2023)   Social Connection and Isolation Panel [NHANES]    Frequency of Communication with Friends and Family: More than three times a week    Frequency of Social Gatherings with Friends and Family: More than three times a week    Attends Religious Services: More than 4 times per year    Active Member of [employer] or Organizations: Yes    Attends Banker Meetings: More than 4 times per year    Marital Status: Widowed  Intimate Partner Violence: Not At Risk  (09/07/2022)   Humiliation, Afraid, Rape, and Kick questionnaire    Fear of Current or Ex-Partner: No    Emotionally Abused: No    Physically Abused: No    Sexually Abused: No    Review of Systems: See HPI, otherwise negative ROS  Physical Exam: BP 118/65   Pulse 64   Temp (!) 96.9 F (36.1 C) (Temporal)   Resp 16   Ht 5\' 5"  (1.651 m)   Wt 92.1 kg   SpO2 100%   BMI 33.78 kg/m  General:   Alert,  pleasant and cooperative in NAD Head:  Normocephalic and atraumatic. Neck:  Supple; no masses or thyromegaly. Lungs:  Clear throughout to auscultation.    Heart:  Regular rate and rhythm. Abdomen:  Soft, nontender and nondistended. Normal bowel sounds, without guarding, and without rebound.   Neurologic:  Alert and  oriented x4;  grossly normal neurologically.  Impression/Plan: Sheri Arnold is here for an colonoscopy to be performed for h/o colon adenomas  Risks, benefits, limitations, and alternatives regarding  colonoscopy have been reviewed with the patient.  Questions have been answered.  All parties agreeable.   Lannette Donath, MD  07/08/2023, 11:23 AM

## 2023-07-08 NOTE — Transfer of Care (Signed)
Immediate Anesthesia Transfer of Care Note  Patient: Sheri Arnold  Procedure(s) Performed: COLONOSCOPY WITH PROPOFOL  Patient Location: PACU  Anesthesia Type:General  Level of Consciousness: awake, alert , and oriented  Airway & Oxygen Therapy: Patient Spontanous Breathing  Post-op Assessment: Report given to RN and Post -op Vital signs reviewed and stable  Post vital signs: Reviewed and stable  Last Vitals:  Vitals Value Taken Time  BP 141/58 07/08/23 1201  Temp    Pulse 62 07/08/23 1201  Resp 15 07/08/23 1201  SpO2 100 % 07/08/23 1201  Vitals shown include unfiled device data.  Last Pain:  Vitals:   07/08/23 1039  TempSrc: Temporal  PainSc: 0-No pain         Complications: No notable events documented.

## 2023-07-09 ENCOUNTER — Encounter: Payer: Self-pay | Admitting: Gastroenterology

## 2023-07-18 ENCOUNTER — Other Ambulatory Visit: Payer: Self-pay | Admitting: Internal Medicine

## 2023-07-20 MED ORDER — METFORMIN HCL ER 750 MG PO TB24: 750.0000 mg | ORAL_TABLET | Freq: Every day | ORAL | 2 refills | Status: DC

## 2023-11-26 ENCOUNTER — Telehealth: Admitting: Family Medicine

## 2023-11-26 DIAGNOSIS — H6692 Otitis media, unspecified, left ear: Secondary | ICD-10-CM | POA: Diagnosis not present

## 2023-11-26 MED ORDER — AMOXICILLIN-POT CLAVULANATE 875-125 MG PO TABS: 1.0000 | ORAL_TABLET | Freq: Two times a day (BID) | ORAL | 0 refills | Status: AC

## 2023-11-26 NOTE — Progress Notes (Signed)
 Virtual Visit Consent   Sheri Arnold, you are scheduled for a virtual visit with a [facility] provider today. Just as with appointments in the office, your consent must be obtained to participate. Your consent will be active for this visit and any virtual visit you may have with one of our providers in the next 365 days. If you have a MyChart account, a copy of this consent can be sent to you electronically.  As this is a virtual visit, video technology does not allow for your provider to perform a traditional examination. This may limit your provider's ability to fully assess your condition. If your provider identifies any concerns that need to be evaluated in person or the need to arrange testing (such as labs, EKG, etc.), we will make arrangements to do so. Although advances in technology are sophisticated, we cannot ensure that it will always work on either your end or our end. If the connection with a video visit is poor, the visit may have to be switched to a telephone visit. With either a video or telephone visit, we are not always able to ensure that we have a secure connection.  By engaging in this virtual visit, you consent to the provision of healthcare and authorize for your insurance to be billed (if applicable) for the services provided during this visit. Depending on your insurance coverage, you may receive a charge related to this service.  I need to obtain your verbal consent now. Are you willing to proceed with your visit today? Sheri Arnold has provided verbal consent on 11/26/2023 for a virtual visit (video or telephone). Roosvelt Mater, [place]  Date: 11/26/2023 9:07 AM   Virtual Visit via Video Note   I, Roosvelt Mater, connected with  Sheri Arnold  ([id number], 08-06-1950) on 11/26/23 at  9:00 AM EDT by a video-enabled telemedicine application and verified that I am speaking with the correct person using two identifiers.  Location: Patient: Virtual Visit Location Patient:  Home Provider: Virtual Visit Location Provider: Home Office   I discussed the limitations of evaluation and management by telemedicine and the availability of in person appointments. The patient expressed understanding and agreed to proceed.    History of Present Illness: Sheri Arnold is a 73 y.o. who identifies as a female who was assigned female at birth, and is being seen today for c/o having an earache for 10 days.  Pt states she has a left earache. Pt states she has congestion and has had an elevated fever. Pt states when she uses the netty pot junk comes out. Pt states she has been taking acetaminophen and ibuprofen but the symptoms are keeping her up.  Pt states she usually treat her symptoms on her own.  Pt states she does not like to use Flonase  and rather just continue the netty pot.   HPI: HPI  Problems:  Patient Active Problem List   Diagnosis Date Noted   History of colonic polyps 07/08/2023   Do not resuscitate status 03/01/2023   Paronychia of great toe 10/19/2022   Myalgia due to statin 02/11/2022   Type 2 diabetes mellitus with hyperlipidemia (HCC) 01/22/2021   Tubular adenoma of colon 12/21/2019   History of multiple concussions 06/05/2019   Advanced care planning/counseling discussion 02/22/2018   Hyperlipidemia 06/03/2017   Hypothyroidism    Hx of dysplastic nevus 08/22/2010    Allergies:  Allergies  Allergen Reactions   Atorvastatin  Other (See Comments)    Muscle pain  Statins    Zetia  [Ezetimibe ]     Muscle pain    Epinephrine Anxiety   Medications:  Current Outpatient Medications:    amoxicillin -clavulanate (AUGMENTIN ) 875-125 MG tablet, Take 1 tablet by mouth 2 (two) times daily for 7 days., Disp: 14 tablet, Rfl: 0   betamethasone  valerate (VALISONE ) 0.1 % cream, Apply topically daily., Disp: 45 g, Rfl: 1   levothyroxine  (SYNTHROID ) 75 MCG tablet, TAKE 1 TABLET BY MOUTH EVERY DAY, Disp: 90 tablet, Rfl: 3   metFORMIN  (GLUCOPHAGE -XR) 750 MG 24 hr tablet,  Take 1 tablet (750 mg total) by mouth daily with breakfast., Disp: 90 tablet, Rfl: 2   minoxidil  (LONITEN ) 2.5 MG tablet, TAKE 1 TABLET BY MOUTH EVERY DAY, Disp: 90 tablet, Rfl: 1  Observations/Objective: Patient is well-developed, well-nourished in no acute distress.  Resting comfortably at home.  Head is normocephalic, atraumatic.  No labored breathing.  Speech is clear and coherent with logical content.  Patient is alert and oriented at baseline.    Assessment and Plan: 1. Left otitis media, unspecified otitis media type (Primary) - amoxicillin -clavulanate (AUGMENTIN ) 875-125 MG tablet; Take 1 tablet by mouth 2 (two) times daily for 7 days.  Dispense: 14 tablet; Refill: 0  -Start Augmentin   -Pt to F/U with PCP or urgent care for worsening symptoms   Follow Up Instructions: I discussed the assessment and treatment plan with the patient. The patient was provided an opportunity to ask questions and all were answered. The patient agreed with the plan and demonstrated an understanding of the instructions.  A copy of instructions were sent to the patient via MyChart unless otherwise noted below.    The patient was advised to call back or seek an in-person evaluation if the symptoms worsen or if the condition fails to improve as anticipated.    Roosvelt Mater, PA-C

## 2023-11-26 NOTE — Patient Instructions (Signed)
 Sheri Arnold, thank you for joining Roosvelt Mater, PA-C for today's virtual visit.  While this provider is not your primary care provider (PCP), if your PCP is located in our provider database this encounter information will be shared with them immediately following your visit.   A [facility] MyChart account gives you access to today's visit and all your visits, tests, and labs performed at [facility]  click here if you don't have a [facility] MyChart account or go to [email or web address]  Consent: (Patient) Sheri Arnold provided verbal consent for this virtual visit at the beginning of the encounter.  Current Medications:  Current Outpatient Medications:    amoxicillin -clavulanate (AUGMENTIN ) 875-125 MG tablet, Take 1 tablet by mouth 2 (two) times daily for 7 days., Disp: 14 tablet, Rfl: 0   betamethasone  valerate (VALISONE ) 0.1 % cream, Apply topically daily., Disp: 45 g, Rfl: 1   levothyroxine  (SYNTHROID ) 75 MCG tablet, TAKE 1 TABLET BY MOUTH EVERY DAY, Disp: 90 tablet, Rfl: 3   metFORMIN  (GLUCOPHAGE -XR) 750 MG 24 hr tablet, Take 1 tablet (750 mg total) by mouth daily with breakfast., Disp: 90 tablet, Rfl: 2   minoxidil  (LONITEN ) 2.5 MG tablet, TAKE 1 TABLET BY MOUTH EVERY DAY, Disp: 90 tablet, Rfl: 1   Medications ordered in this encounter:  Meds ordered this encounter  Medications   amoxicillin -clavulanate (AUGMENTIN ) 875-125 MG tablet    Sig: Take 1 tablet by mouth 2 (two) times daily for 7 days.    Dispense:  14 tablet    Refill:  0     *If you need refills on other medications prior to your next appointment, please contact your pharmacy*  Follow-Up: Call back or seek an in-person evaluation if the symptoms worsen or if the condition fails to improve as anticipated.  [facility] [phone]  Other Instructions Otitis Media, Adult  Otitis media is a condition in which the middle ear is red and swollen (inflamed) and full of fluid. The  middle ear is the part of the ear that contains bones for hearing as well as air that helps send sounds to the brain. The condition usually goes away on its own. What are the causes? This condition is caused by a blockage in the eustachian tube. This tube connects the middle ear to the back of the nose. It normally allows air into the middle ear. The blockage is caused by fluid or swelling. Problems that can cause blockage include: A cold or infection that affects the nose, mouth, or throat. Allergies. An irritant, such as tobacco smoke. Adenoids that have become large. The adenoids are soft tissue located in the back of the throat, behind the nose and the roof of the mouth. Growth or swelling in the upper part of the throat, just behind the nose (nasopharynx). Damage to the ear caused by a change in pressure. This is called barotrauma. What increases the risk? You are more likely to develop this condition if you: Smoke or are exposed to tobacco smoke. Have an opening in the roof of your mouth (cleft palate). Have acid reflux. Have problems in your body's defense system (immune system). What are the signs or symptoms? Symptoms of this condition include: Ear pain. Fever. Problems with hearing. Being tired. Fluid leaking from the ear. Ringing in the ear. How is this treated? This condition can go away on its own within 3-5 days. But if the condition is caused by germs (bacteria) and does not go away on its  own, or if it keeps coming back, your doctor may: Give you antibiotic medicines. Give you medicines for pain. Follow these instructions at home: Take over-the-counter and prescription medicines only as told by your doctor. If you were prescribed an antibiotic medicine, take it as told by your doctor. Do not stop taking it even if you start to feel better. Keep all follow-up visits. Contact a doctor if: You have bleeding from your nose. There is a lump on your neck. You are not  feeling better in 5 days. You feel worse instead of better. Get help right away if: You have pain that is not helped with medicine. You have swelling, redness, or pain around your ear. You get a stiff neck. You cannot move part of your face (paralysis). You notice that the bone behind your ear hurts when you touch it. You get a very bad headache. Summary Otitis media means that the middle ear is red, swollen, and full of fluid. This condition usually goes away on its own. If the problem does not go away, treatment may be needed. You may be given medicines to treat the infection or to treat your pain. If you were prescribed an antibiotic medicine, take it as told by your doctor. Do not stop taking it even if you start to feel better. Keep all follow-up visits. This information is not intended to replace advice given to you by your health care provider. Make sure you discuss any questions you have with your health care provider. Document Revised: 08/19/2020 Document Reviewed: 08/19/2020 Elsevier Patient Education  2024 Elsevier Inc.   If you have been instructed to have an in-person evaluation today at a local Urgent Care facility, please use the link below. It will take you to a list of all of our available [facility], including address, phone number and hours of operation. Please do not delay care.  [facility]  If you or a family member do not have a primary care provider, use the link below to schedule a visit and establish care. When you choose a [facility] primary care physician or advanced practice provider, you gain a long-term partner in health. Find a Primary Care Provider  Learn more about [facility]'s in-office and virtual care options: [facility] - Get Care Now

## 2024-02-08 ENCOUNTER — Ambulatory Visit (INDEPENDENT_AMBULATORY_CARE_PROVIDER_SITE_OTHER): Payer: Medicare Other | Admitting: Dermatology

## 2024-02-08 DIAGNOSIS — L720 Epidermal cyst: Secondary | ICD-10-CM

## 2024-02-08 DIAGNOSIS — L900 Lichen sclerosus et atrophicus: Secondary | ICD-10-CM | POA: Diagnosis not present

## 2024-02-08 DIAGNOSIS — L649 Androgenic alopecia, unspecified: Secondary | ICD-10-CM | POA: Diagnosis not present

## 2024-02-08 DIAGNOSIS — L821 Other seborrheic keratosis: Secondary | ICD-10-CM | POA: Diagnosis not present

## 2024-02-08 DIAGNOSIS — L820 Inflamed seborrheic keratosis: Secondary | ICD-10-CM

## 2024-02-08 DIAGNOSIS — D1801 Hemangioma of skin and subcutaneous tissue: Secondary | ICD-10-CM

## 2024-02-08 DIAGNOSIS — W57XXXA Bitten or stung by nonvenomous insect and other nonvenomous arthropods, initial encounter: Secondary | ICD-10-CM

## 2024-02-08 DIAGNOSIS — L814 Other melanin hyperpigmentation: Secondary | ICD-10-CM

## 2024-02-08 DIAGNOSIS — L578 Other skin changes due to chronic exposure to nonionizing radiation: Secondary | ICD-10-CM

## 2024-02-08 DIAGNOSIS — Z1283 Encounter for screening for malignant neoplasm of skin: Secondary | ICD-10-CM | POA: Diagnosis not present

## 2024-02-08 DIAGNOSIS — W908XXA Exposure to other nonionizing radiation, initial encounter: Secondary | ICD-10-CM | POA: Diagnosis not present

## 2024-02-08 DIAGNOSIS — L570 Actinic keratosis: Secondary | ICD-10-CM | POA: Diagnosis not present

## 2024-02-08 DIAGNOSIS — D229 Melanocytic nevi, unspecified: Secondary | ICD-10-CM

## 2024-02-08 DIAGNOSIS — Z86018 Personal history of other benign neoplasm: Secondary | ICD-10-CM

## 2024-02-08 DIAGNOSIS — D225 Melanocytic nevi of trunk: Secondary | ICD-10-CM

## 2024-02-08 NOTE — Progress Notes (Signed)
 Follow-Up Visit   Subjective  Sheri Arnold is a 73 y.o. female who presents for the following: Skin Cancer Screening and Full Body Skin Exam  The patient presents for Total-Body Skin Exam (TBSE) for skin cancer screening and mole check. The patient has spots, moles and lesions to be evaluated, some may be new or changing, including irritating spot on the back at bra.  The following portions of the chart were reviewed this encounter and updated as appropriate: medications, allergies, medical history  Review of Systems:  No other skin or systemic complaints except as noted in HPI or Assessment and Plan.  Objective  Well appearing patient in no apparent distress; mood and affect are within normal limits.  A full examination was performed including scalp, head, eyes, ears, nose, lips, neck, chest, axillae, abdomen, back, buttocks, bilateral upper extremities, bilateral lower extremities, hands, feet, fingers, toes, fingernails, and toenails. All findings within normal limits unless otherwise noted below.   Relevant physical exam findings are noted in the Assessment and Plan.  L post flank at bra line Erythematous stuck-on, waxy papule R upper lip Pink scaly macule.   Assessment & Plan   SKIN CANCER SCREENING PERFORMED TODAY.  ACTINIC DAMAGE - Chronic condition, secondary to cumulative UV/sun exposure - diffuse scaly erythematous macules with underlying dyspigmentation - Recommend daily broad spectrum sunscreen SPF 30+ to sun-exposed areas, reapply every 2 hours as needed.  - Staying in the shade or wearing long sleeves, sun glasses (UVA+UVB protection) and wide brim hats (4-inch brim around the entire circumference of the hat) are also recommended for sun protection.  - Call for new or changing lesions.  LENTIGINES, SEBORRHEIC KERATOSES, HEMANGIOMAS - Benign normal skin lesions - Benign-appearing - Call for any changes   MELANOCYTIC NEVI - Tan-brown and/or pink-flesh-colored  symmetric macules and papules - L spinal lower back 4 x 2 mm medium brown macule - Mid sternum 1.0 cm fleshy brown papule - L medial buttock 2 mm med dark brown macule - Benign appearing on exam today - Observation - Call clinic for new or changing moles - Recommend daily use of broad spectrum spf 30+ sunscreen to sun-exposed areas.   SEBORRHEIC KERATOSIS - 1.8 x 1.5cm waxy tan speckled patch of the crown scalp, photo from 06/30/21 compared, Benign features under dermoscopy.  - stable - Benign-appearing - Discussed benign etiology and prognosis. - Observe - Call for any changes  Milia - tiny firm white papules - type of cyst - benign - sometimes these will clear with nightly OTC adapalene/Differin 0.1% gel or retinol. - may be extracted if symptomatic - observe  History of Dysplastic Nevus - No evidence of recurrence today - Recommend regular full body skin exams - Recommend daily broad spectrum sunscreen SPF 30+ to sun-exposed areas, reapply every 2 hours as needed.  - Call if any new or changing lesions are noted between office visits  INSECT BITE REACTION Exam: edematous pink papules left shoulder, right leg, right post thigh  Treatment Plan: Benign, observe.   Start Betamethasone  cream prn itch. Pt has  Recommend DEET containing insect repellant (25% DEET or higher) such as [place] Off or Repel Sportsmen and wear protective clothing when outdoors.    ANDROGENETIC ALOPECIA (FEMALE PATTERN HAIR LOSS) Exam: Mild thinning hair of crown.   Chronic and persistent condition with duration or expected duration over one year. Condition is improving with treatment but not currently at goal.    Female Androgenic Alopecia is a chronic condition  related to genetics and/or hormonal changes.  In women androgenetic alopecia is commonly associated with menopause but may occur any time after puberty.  It causes hair thinning primarily on the crown with widening of the part and  temporal hairline recession.  Can use OTC Rogaine  (minoxidil ) 5% solution/foam as directed.  Oral treatments in female patients who have no contraindication may include : - Low dose oral minoxidil  1.25 - 5mg  daily - Spironolactone 50 - 100mg  bid - Finasteride 2.5 - 5 mg daily Adjunctive therapies include: - Low Level Laser Light Therapy (LLLT) - Platelet-rich plasma injections (PRP) - Hair Transplants or scalp reduction    Treatment Plan: BP 103/71 Continue minoxidil  2.5 MG 1/2 tablet daily. Patient will call for refills.   Doses of minoxidil  for hair loss are considered 'low dose'. This is because the doses used for hair loss are much lower than the doses which are used for conditions such as high blood pressure (hypertension). The doses used for hypertension are 10-40mg  per day.  Side effects are uncommon at the low doses (up to 2.5 mg/day) used to treat hair loss. Potential side effects, more commonly seen at higher doses, include: Increase in hair growth (hypertrichosis) elsewhere on face and body Temporary hair shedding upon starting medication which may last up to 4 weeks Ankle swelling, fluid retention, rapid weight gain more than 5 pounds Low blood pressure and feeling lightheaded or dizzy when standing up quickly Fast or irregular heartbeat Headaches   Long term medication management.  Patient is using long term (months to years) prescription medication  to control their dermatologic condition.  These medications require periodic monitoring to evaluate for efficacy and side effects and may require periodic laboratory monitoring.    LICHEN SCLEROSUS ET ATROPHICUS- Extragenital Biopsy proven 05/06/2022 Chronic and persistent condition with duration or expected duration over one year.  No improvement but stable.  Has tried and failed Tacrolimus , Zoryve, Opzelura, Vtama. Not bothersome to patient, so prefers just to treat as needed with topical steroid.    Exam: White smooth patches  abdomen and plaques at inframammary   Lichen sclerosus is a chronic inflammatory condition of unknown cause that frequently involves the vaginal area and less commonly extragenital skin, and is NOT sexually transmitted. It frequently causes symptoms of pain and burning.  It requires regular monitoring and treatment with topical steroids to minimize inflammation and to reduce risk of scarring. There is also a risk of cancer in the vaginal area which is very low if inflammation is well controlled. Regular checks of the area are recommended. Please call if you notice any new or changing spots within this area.   Treatment Plan: No treatment at this time. Not bothersome to patient.   INFLAMED SEBORRHEIC KERATOSIS L post flank at bra line Symptomatic, irritating, patient would like treated. Destruction of lesion - L post flank at bra line  Destruction method: cryotherapy   Informed consent: discussed and consent obtained   Lesion destroyed using liquid nitrogen: Yes   Region frozen until ice ball extended beyond lesion: Yes   Outcome: patient tolerated procedure well with no complications   Post-procedure details: wound care instructions given   Additional details:  Prior to procedure, discussed risks of blister formation, small wound, skin dyspigmentation, or rare scar following cryotherapy. Recommend Vaseline ointment to treated areas while healing.   AK (ACTINIC KERATOSIS) R upper lip Actinic keratoses are precancerous spots that appear secondary to cumulative UV radiation exposure/sun exposure over time. They are chronic  with expected duration over 1 year. A portion of actinic keratoses will progress to squamous cell carcinoma of the skin. It is not possible to reliably predict which spots will progress to skin cancer and so treatment is recommended to prevent development of skin cancer.  Recommend daily broad spectrum sunscreen SPF 30+ to sun-exposed areas, reapply every 2 hours as needed.   Recommend staying in the shade or wearing long sleeves, sun glasses (UVA+UVB protection) and wide brim hats (4-inch brim around the entire circumference of the hat). Call for new or changing lesions. Destruction of lesion - R upper lip  Destruction method: cryotherapy   Informed consent: discussed and consent obtained   Lesion destroyed using liquid nitrogen: Yes   Region frozen until ice ball extended beyond lesion: Yes   Outcome: patient tolerated procedure well with no complications   Post-procedure details: wound care instructions given   Additional details:  Prior to procedure, discussed risks of blister formation, small wound, skin dyspigmentation, or rare scar following cryotherapy. Recommend Vaseline ointment to treated areas while healing.   Return in about 1 year (around 02/07/2025) for TBSE.  IAndrea Kerns, CMA, am acting as scribe for Rexene Rattler, MD .   Documentation: I have reviewed the above documentation for accuracy and completeness, and I agree with the above.  Rexene Rattler, MD

## 2024-02-08 NOTE — Patient Instructions (Addendum)

## 2024-02-25 ENCOUNTER — Other Ambulatory Visit: Payer: Self-pay | Admitting: Dermatology

## 2024-02-25 DIAGNOSIS — L649 Androgenic alopecia, unspecified: Secondary | ICD-10-CM

## 2024-03-20 ENCOUNTER — Other Ambulatory Visit: Payer: Self-pay | Admitting: Internal Medicine

## 2024-03-23 NOTE — Telephone Encounter (Signed)
 Refilled: 09/2022 Last OV: 06/16/2023 Next OV: not scheduled

## 2024-04-07 ENCOUNTER — Encounter: Payer: Self-pay | Admitting: Internal Medicine

## 2024-04-07 ENCOUNTER — Other Ambulatory Visit: Payer: Self-pay | Admitting: Internal Medicine

## 2024-04-07 DIAGNOSIS — H40013 Open angle with borderline findings, low risk, bilateral: Secondary | ICD-10-CM | POA: Diagnosis not present

## 2024-04-07 DIAGNOSIS — H26493 Other secondary cataract, bilateral: Secondary | ICD-10-CM | POA: Diagnosis not present

## 2024-04-07 DIAGNOSIS — E119 Type 2 diabetes mellitus without complications: Secondary | ICD-10-CM | POA: Diagnosis not present

## 2024-04-07 LAB — OPHTHALMOLOGY REPORT-SCANNED

## 2024-04-09 ENCOUNTER — Encounter: Payer: Self-pay | Admitting: Internal Medicine

## 2024-04-10 ENCOUNTER — Telehealth: Payer: Self-pay

## 2024-04-10 ENCOUNTER — Other Ambulatory Visit: Payer: Self-pay

## 2024-04-10 DIAGNOSIS — R5383 Other fatigue: Secondary | ICD-10-CM

## 2024-04-10 DIAGNOSIS — E1169 Type 2 diabetes mellitus with other specified complication: Secondary | ICD-10-CM

## 2024-04-10 DIAGNOSIS — E039 Hypothyroidism, unspecified: Secondary | ICD-10-CM

## 2024-04-10 MED ORDER — METFORMIN HCL ER 750 MG PO TB24: 750.0000 mg | ORAL_TABLET | Freq: Every day | ORAL | 3 refills | Status: AC

## 2024-04-10 NOTE — Telephone Encounter (Signed)
 Noted

## 2024-04-10 NOTE — Telephone Encounter (Signed)
 Copied from CRM #[id number]. Topic: Clinical - Request for Lab/Test Order >> Apr 10, 2024  9:13 AM Lonell PEDLAR wrote: Reason for CRM: Patient is requesting to have routine labs done prior to the end of the year, ideally before Christmas. Please review and advise.

## 2024-04-14 ENCOUNTER — Ambulatory Visit: Payer: Self-pay

## 2024-04-14 NOTE — Telephone Encounter (Signed)
 FYI Only or Action Required?: FYI only for provider: appointment scheduled on 04/17/2024 at 11am with Dr Jacques Copland at [facility].  Patient was last seen in primary care on 11/26/2023 by Kingston Robes, PA-C.  Called Nurse Triage reporting Knee Pain.  Symptoms began patient states a long time but just had gotten more noticeable.  Interventions attempted: OTC medications: CBD cream, Rest, hydration, or home remedies, and Other: sometimes a knee brace.  Symptoms are: unchanged.  Triage Disposition: See PCP When Office is Open (Within 3 Days)  Patient/caregiver understands and will follow disposition?: Yes                        Copied from CRM #[id number]. Topic: Clinical - Red Word Triage >> Apr 14, 2024  9:04 AM Viola F wrote: Patient having right knee pain, would like referral to physical therapy Reason for Disposition  [1] MODERATE pain (e.g., interferes with normal activities, limping) AND [2] present > 3 days  Answer Assessment - Initial Assessment Questions Patient states her husband had ALS and in 2016 she was transferring him from the wheelchair to the bed and she felt a pop in her right knee She also states that she has been favoring it so much that it hurts more now  Patient states she wants a referral to physical therapy if possible  Patient is advised to call us  back if anything changes or with any further questions/concerns. Patient is advised that if anything worsens to go to the Emergency Room. Patient verbalized understanding.  Protocols used: Knee Pain-A-AH

## 2024-04-16 NOTE — Progress Notes (Unsigned)
     Tyee Vandevoorde T. Koray Soter, MD, CAQ Sports Medicine [facility] [address] [place], [address]  Phone: [phone]  FAX: [phone]  Sheri Arnold - 73 y.o. female  MRN [id number]  Date of Birth: 17-Aug-1950  Date: 04/17/2024  PCP: Marylynn Verneita CROME, MD  Referral: Marylynn Verneita CROME, MD  No chief complaint on file.  Subjective:   Sheri Arnold is a 73 y.o. very pleasant female patient with There is no height or weight on file to calculate BMI. who presents with the following:  Discussed the use of AI scribe software for clinical note transcription with the patient, who gave verbal consent to proceed.  Patient presents with ongoing right-sided knee pain.  She is 73, I do not see any prior radiographs. History of Present Illness     Review of Systems is noted in the HPI, as appropriate  Objective:   There were no vitals taken for this visit.  GEN: No acute distress; alert,appropriate. PULM: Breathing comfortably in no respiratory distress PSYCH: Normally interactive.   Laboratory and Imaging Data:  Assessment and Plan:   No diagnosis found. Assessment & Plan   Medication Management during today's office visit: No orders of the defined types were placed in this encounter.  There are no discontinued medications.  Orders placed today for conditions managed today: No orders of the defined types were placed in this encounter.   Disposition: No follow-ups on file.  [employer] One speech-to-text software was used for transcription in this dictation.  Possible transcriptional errors can occur using Animal nutritionist.   Signed,  Jacques DASEN. Genever Hentges, MD   Outpatient Encounter Medications as of 04/17/2024  Medication Sig   betamethasone  valerate (VALISONE ) 0.1 % cream APPLY TO AFFECTED AREA TOPICALLY EVERY DAY   levothyroxine  (SYNTHROID ) 75 MCG tablet TAKE 1 TABLET BY MOUTH EVERY DAY   metFORMIN  (GLUCOPHAGE -XR) 750 MG 24 hr  tablet Take 1 tablet (750 mg total) by mouth daily with breakfast.   minoxidil  (LONITEN ) 2.5 MG tablet TAKE 1 TABLET BY MOUTH EVERY DAY   No facility-administered encounter medications on file as of 04/17/2024.

## 2024-04-17 ENCOUNTER — Encounter: Payer: Self-pay | Admitting: Family Medicine

## 2024-04-17 ENCOUNTER — Ambulatory Visit (INDEPENDENT_AMBULATORY_CARE_PROVIDER_SITE_OTHER): Admitting: Family Medicine

## 2024-04-17 VITALS — BP 116/60 | HR 76 | Temp 97.1°F | Ht 65.0 in | Wt 201.0 lb

## 2024-04-17 DIAGNOSIS — M25561 Pain in right knee: Secondary | ICD-10-CM

## 2024-04-17 DIAGNOSIS — G8929 Other chronic pain: Secondary | ICD-10-CM

## 2024-05-03 ENCOUNTER — Telehealth: Payer: Self-pay

## 2024-05-03 DIAGNOSIS — R2681 Unsteadiness on feet: Secondary | ICD-10-CM | POA: Diagnosis not present

## 2024-05-03 DIAGNOSIS — R293 Abnormal posture: Secondary | ICD-10-CM | POA: Diagnosis not present

## 2024-05-03 DIAGNOSIS — M25561 Pain in right knee: Secondary | ICD-10-CM | POA: Diagnosis not present

## 2024-05-03 NOTE — Telephone Encounter (Signed)
 Referral was made on April 17, 2024.  Please assist the patient setting up physical therapy and completing this formal physical therapy referral.

## 2024-05-03 NOTE — Telephone Encounter (Signed)
 Looks like the referral was placed by Dr. Watt

## 2024-05-03 NOTE — Telephone Encounter (Signed)
 Copied from CRM #[id number]. Topic: General - Other >> May 03, 2024  2:49 PM Rosina BIRCH wrote: Reason for CRM: heather from [facility] called stating she need the referral for the patient [phone]

## 2024-05-04 ENCOUNTER — Encounter: Payer: Self-pay | Admitting: *Deleted

## 2024-05-04 NOTE — Telephone Encounter (Addendum)
 Referral processed. Sorry for the delay.   I called [facility] and spoke with Powell, patient was seen yesterday 05/03/24 and they just needed the faxed referral to be able to close out her visit,for Medicare AB purposes. Powell stated that the patient called and schedule her appt with them on 04/17/24, after her visit with Dr Watt. I verified the number that I faxed this to and they stated that they would call back if anything further is needed.

## 2024-05-08 DIAGNOSIS — R2681 Unsteadiness on feet: Secondary | ICD-10-CM | POA: Diagnosis not present

## 2024-05-08 DIAGNOSIS — R293 Abnormal posture: Secondary | ICD-10-CM | POA: Diagnosis not present

## 2024-05-08 DIAGNOSIS — M25561 Pain in right knee: Secondary | ICD-10-CM | POA: Diagnosis not present

## 2024-05-09 ENCOUNTER — Other Ambulatory Visit (INDEPENDENT_AMBULATORY_CARE_PROVIDER_SITE_OTHER)

## 2024-05-09 DIAGNOSIS — E1169 Type 2 diabetes mellitus with other specified complication: Secondary | ICD-10-CM | POA: Diagnosis not present

## 2024-05-09 DIAGNOSIS — R5383 Other fatigue: Secondary | ICD-10-CM | POA: Diagnosis not present

## 2024-05-09 DIAGNOSIS — E039 Hypothyroidism, unspecified: Secondary | ICD-10-CM | POA: Diagnosis not present

## 2024-05-09 DIAGNOSIS — E785 Hyperlipidemia, unspecified: Secondary | ICD-10-CM | POA: Diagnosis not present

## 2024-05-09 LAB — CBC WITH DIFFERENTIAL/PLATELET
Basophils Absolute: 0.1 K/uL (ref 0.0–0.1)
Basophils Relative: 1.2 % (ref 0.0–3.0)
Eosinophils Absolute: 0.7 K/uL (ref 0.0–0.7)
Eosinophils Relative: 13.3 % — ABNORMAL HIGH (ref 0.0–5.0)
HCT: 39.3 % (ref 36.0–46.0)
Hemoglobin: 13.1 g/dL (ref 12.0–15.0)
Lymphocytes Relative: 23.4 % (ref 12.0–46.0)
Lymphs Abs: 1.3 K/uL (ref 0.7–4.0)
MCHC: 33.2 g/dL (ref 30.0–36.0)
MCV: 89.1 fl (ref 78.0–100.0)
Monocytes Absolute: 0.5 K/uL (ref 0.1–1.0)
Monocytes Relative: 8.9 % (ref 3.0–12.0)
Neutro Abs: 2.9 K/uL (ref 1.4–7.7)
Neutrophils Relative %: 53.2 % (ref 43.0–77.0)
Platelets: 289 K/uL (ref 150.0–400.0)
RBC: 4.41 Mil/uL (ref 3.87–5.11)
RDW: 13.8 % (ref 11.5–15.5)
WBC: 5.5 K/uL (ref 4.0–10.5)

## 2024-05-09 LAB — HEMOGLOBIN A1C: Hgb A1c MFr Bld: 6.4 % (ref 4.6–6.5)

## 2024-05-09 LAB — TSH: TSH: 1.34 u[IU]/mL (ref 0.35–5.50)

## 2024-05-09 LAB — COMPREHENSIVE METABOLIC PANEL WITH GFR
ALT: 20 U/L (ref 0–35)
AST: 32 U/L (ref 5–37)
Albumin: 4.2 g/dL (ref 3.5–5.2)
Alkaline Phosphatase: 78 U/L (ref 39–117)
BUN: 19 mg/dL (ref 6–23)
CO2: 26 meq/L (ref 19–32)
Calcium: 9.3 mg/dL (ref 8.4–10.5)
Chloride: 102 meq/L (ref 96–112)
Creatinine, Ser: 0.69 mg/dL (ref 0.40–1.20)
GFR: 87.42 mL/min (ref >60.00)
Glucose, Bld: 117 mg/dL — ABNORMAL HIGH (ref 70–99)
Potassium: 5.2 meq/L — ABNORMAL HIGH (ref 3.5–5.1)
Sodium: 136 meq/L (ref 135–145)
Total Bilirubin: 0.4 mg/dL (ref 0.2–1.2)
Total Protein: 7.1 g/dL (ref 6.0–8.3)

## 2024-05-11 ENCOUNTER — Ambulatory Visit: Payer: Self-pay | Admitting: Internal Medicine

## 2024-06-06 ENCOUNTER — Ambulatory Visit: Admitting: Internal Medicine

## 2024-06-06 VITALS — BP 98/70 | HR 58 | Temp 97.6°F | Ht 65.0 in | Wt 200.8 lb

## 2024-06-06 DIAGNOSIS — E782 Mixed hyperlipidemia: Secondary | ICD-10-CM | POA: Diagnosis not present

## 2024-06-06 DIAGNOSIS — G8929 Other chronic pain: Secondary | ICD-10-CM

## 2024-06-06 DIAGNOSIS — M25561 Pain in right knee: Secondary | ICD-10-CM | POA: Diagnosis not present

## 2024-06-06 DIAGNOSIS — E039 Hypothyroidism, unspecified: Secondary | ICD-10-CM

## 2024-06-06 DIAGNOSIS — Z66 Do not resuscitate: Secondary | ICD-10-CM

## 2024-06-06 DIAGNOSIS — E1169 Type 2 diabetes mellitus with other specified complication: Secondary | ICD-10-CM

## 2024-06-06 DIAGNOSIS — E785 Hyperlipidemia, unspecified: Secondary | ICD-10-CM | POA: Diagnosis not present

## 2024-06-06 DIAGNOSIS — Z1231 Encounter for screening mammogram for malignant neoplasm of breast: Secondary | ICD-10-CM

## 2024-06-06 DIAGNOSIS — Z780 Asymptomatic menopausal state: Secondary | ICD-10-CM | POA: Diagnosis not present

## 2024-06-06 LAB — MICROALBUMIN / CREATININE URINE RATIO
Creatinine,U: 56.3 mg/dL
Microalb Creat Ratio: UNDETERMINED mg/g (ref 0.0–30.0)
Microalb, Ur: <0.7 mg/dL

## 2024-06-06 LAB — COMPREHENSIVE METABOLIC PANEL WITH GFR
ALT: 18 U/L (ref 3–35)
AST: 20 U/L (ref 5–37)
Albumin: 4.4 g/dL (ref 3.5–5.2)
Alkaline Phosphatase: 77 U/L (ref 39–117)
BUN: 13 mg/dL (ref 6–23)
CO2: 25 meq/L (ref 19–32)
Calcium: 10 mg/dL (ref 8.4–10.5)
Chloride: 103 meq/L (ref 96–112)
Creatinine, Ser: 0.65 mg/dL (ref 0.40–1.20)
GFR: 88.64 mL/min
Glucose, Bld: 129 mg/dL — ABNORMAL HIGH (ref 70–99)
Potassium: 4.3 meq/L (ref 3.5–5.1)
Sodium: 138 meq/L (ref 135–145)
Total Bilirubin: 0.4 mg/dL (ref 0.2–1.2)
Total Protein: 7.5 g/dL (ref 6.0–8.3)

## 2024-06-06 MED ORDER — LEVOTHYROXINE SODIUM 75 MCG PO TABS: 75.0000 ug | ORAL_TABLET | Freq: Every day | ORAL | 3 refills | Status: AC

## 2024-06-06 NOTE — Assessment & Plan Note (Signed)
 Improved with strengthening  exercises

## 2024-06-06 NOTE — Assessment & Plan Note (Signed)
 Thyroid  function is WNL on current dose of 75 mcg daily .  No current changes needed.   Lab Results  Component Value Date   TSH 1.34 05/09/2024

## 2024-06-06 NOTE — Assessment & Plan Note (Signed)
 Managed without statin   due to statin myalgia with trial of atorvastatin  40 mg every other day.   Sept 2023 Coronary  calcium  score was 6 (LAD) . Trial of Zetia  was not tolerated   Lab Results  Component Value Date   CHOL 289 (H) 06/16/2023   HDL 76 06/16/2023   LDLCALC 189 (H) 06/16/2023   LDLDIRECT 201.0 06/12/2022   TRIG 116 06/16/2023   CHOLHDL 3.8 06/16/2023

## 2024-06-06 NOTE — Assessment & Plan Note (Signed)
 With obesity , hyperlipidemia and coronary atherosclerosis noted on Cardiac CT  BUT A LOW CALCIUM  SOCRE OF 6.  .Reviewed diagnosis,made with a fasting glucose of 127, A1c of 6.6 , and she has lowered her A1c  in the past to  6.1 She is exercising at the gym using silver sneakers and swimming daily .   Zetia  trial not tolerated due to side effects.l  (she has a history of statin myalgia).  Lab Results  Component Value Date   HGBA1C 6.4 05/09/2024   Lab Results  Component Value Date   LABMICR See below: 02/22/2018   LABMICR See below: 06/03/2017   MICROALBUR <0.7 06/06/2024      Lab Results  Component Value Date   CHOL 289 (H) 06/16/2023   HDL 76 06/16/2023   LDLCALC 189 (H) 06/16/2023   LDLDIRECT 201.0 06/12/2022   TRIG 116 06/16/2023   CHOLHDL 3.8 06/16/2023

## 2024-06-06 NOTE — Patient Instructions (Addendum)
" °  I WANT TO  RECHECK YOUR A1C AFTER APRIL 22  TO MAKE SURE IT IS NOT > 7.0  WHEN I SEND YOU THE RESULTS,  ASK FOR MEDS TO TAKE ON YOUR TRIP  Your annual mammogram AND  DEXA  SCAN have been ordered.  Please call [facility] to call to make your appointments  .  The phone number for [facility] is  [phone]    THE MINOXIDIL  WILL LOWER YOUR BLOOD PRESSURE SO DO NOT LET YOUR DERMATOLOGIST TALK YOU INTO GOING  HIGHER.   "

## 2024-06-06 NOTE — Progress Notes (Signed)
 "  Subjective:  Patient ID: Sheri Arnold, female    DOB: 1950-07-06  Age: 74 y.o. MRN: [id number]  CC: The primary encounter diagnosis was Encounter for screening mammogram for malignant neoplasm of breast. Diagnoses of Postmenopausal estrogen deficiency, Type 2 diabetes mellitus with hyperlipidemia (HCC), Acquired hypothyroidism, Mixed hyperlipidemia, Do not resuscitate status, and Chronic pain of right knee were also pertinent to this visit.   HPI Sheri Arnold presents for  Chief Complaint  Patient presents with   Diabetes   1) right knee pain :  the pain in her right knee has improved  with physical therapy  and she is preparing for a walking tour of the [place].  She is STRETCHING AND EXERCISING AT HOME  2) type 2 DM:   T2DM:  She  feels generally well,   exercising regularly  Checking  blood sugars less than once daily at variable times, usually only if she feels she may be having a hypoglycemic event. .  BS have been under 130 fasting and < 150 post prandially.  Denies any recent hypoglyemic events.  Taking metformin  medications as directed. Following a carbohydrate modified diet 6 days per week. Denies numbness, burning and tingling of extremities. Appetite is good.        Outpatient Medications Prior to Visit  Medication Sig Dispense Refill   betamethasone  valerate (VALISONE ) 0.1 % cream APPLY TO AFFECTED AREA TOPICALLY EVERY DAY 45 g 1   metFORMIN  (GLUCOPHAGE -XR) 750 MG 24 hr tablet Take 1 tablet (750 mg total) by mouth daily with breakfast. 90 tablet 3   minoxidil  (LONITEN ) 2.5 MG tablet Take 1.25 mg by mouth daily.     levothyroxine  (SYNTHROID ) 75 MCG tablet TAKE 1 TABLET BY MOUTH EVERY DAY 90 tablet 3   No facility-administered medications prior to visit.    Review of Systems;  Patient denies headache, fevers, malaise, unintentional weight loss, skin rash, eye pain, sinus congestion and sinus pain, sore throat, dysphagia,  hemoptysis , cough, dyspnea, wheezing, chest  pain, palpitations, orthopnea, edema, abdominal pain, nausea, melena, diarrhea, constipation, flank pain, dysuria, hematuria, urinary  Frequency, nocturia, numbness, tingling, seizures,  Focal weakness, Loss of consciousness,  Tremor, insomnia, depression, anxiety, and suicidal ideation.      Objective:  BP 98/70 (BP Location: Left Arm, Patient Position: Sitting)   Pulse (!) 58   Temp 97.6 F (36.4 C) (Oral)   Ht 5' 5 (1.651 m)   Wt 200 lb 12.8 oz (91.1 kg)   SpO2 99%   BMI 33.41 kg/m   BP Readings from Last 3 Encounters:  06/06/24 98/70  04/17/24 116/60  07/08/23 (!) 107/59    Wt Readings from Last 3 Encounters:  06/06/24 200 lb 12.8 oz (91.1 kg)  04/17/24 201 lb (91.2 kg)  07/08/23 203 lb (92.1 kg)    Physical Exam Vitals reviewed.  Constitutional:      General: She is not in acute distress.    Appearance: Normal appearance. She is normal weight. She is not ill-appearing, toxic-appearing or diaphoretic.  HENT:     Head: Normocephalic.  Eyes:     General: No scleral icterus.       Right eye: No discharge.        Left eye: No discharge.     Conjunctiva/sclera: Conjunctivae normal.  Cardiovascular:     Rate and Rhythm: Normal rate and regular rhythm.     Heart sounds: Normal heart sounds.  Pulmonary:     Effort: Pulmonary effort is  normal. No respiratory distress.     Breath sounds: Normal breath sounds.  Musculoskeletal:        General: Normal range of motion.  Skin:    General: Skin is warm and dry.  Neurological:     General: No focal deficit present.     Mental Status: She is alert and oriented to person, place, and time. Mental status is at baseline.  Psychiatric:        Mood and Affect: Mood normal.        Behavior: Behavior normal.        Thought Content: Thought content normal.        Judgment: Judgment normal.     Lab Results  Component Value Date   HGBA1C 6.4 05/09/2024   HGBA1C 6.8 (H) 06/16/2023   HGBA1C 6.1 (A) 12/14/2022    Lab Results   Component Value Date   CREATININE 0.65 06/06/2024   CREATININE 0.69 05/09/2024   CREATININE 0.66 06/16/2023    Lab Results  Component Value Date   WBC 5.5 05/09/2024   HGB 13.1 05/09/2024   HCT 39.3 05/09/2024   PLT 289.0 05/09/2024   GLUCOSE 129 (H) 06/06/2024   CHOL 289 (H) 06/16/2023   TRIG 116 06/16/2023   HDL 76 06/16/2023   LDLDIRECT 201.0 06/12/2022   LDLCALC 189 (H) 06/16/2023   ALT 18 06/06/2024   AST 20 06/06/2024   NA 138 06/06/2024   K 4.3 06/06/2024   CL 103 06/06/2024   CREATININE 0.65 06/06/2024   BUN 13 06/06/2024   CO2 25 06/06/2024   TSH 1.34 05/09/2024   HGBA1C 6.4 05/09/2024   MICROALBUR <0.7 06/06/2024    No results found.  Assessment & Plan:  .Encounter for screening mammogram for malignant neoplasm of breast -     3D Screening Mammogram, Left and Right; Future  Postmenopausal estrogen deficiency -     DG Bone Density; Future  Type 2 diabetes mellitus with hyperlipidemia [facility]) Assessment & Plan: With obesity , hyperlipidemia and coronary atherosclerosis noted on Cardiac CT  BUT A LOW CALCIUM  SOCRE OF 6.  .Reviewed diagnosis,made with a fasting glucose of 127, A1c of 6.6 , and she has lowered her A1c  in the past to  6.1 She is exercising at the gym using silver sneakers and swimming daily .   Zetia  trial not tolerated due to side effects.l  (she has a history of statin myalgia).  Lab Results  Component Value Date   HGBA1C 6.4 05/09/2024   Lab Results  Component Value Date   LABMICR See below: 02/22/2018   LABMICR See below: 06/03/2017   MICROALBUR <0.7 06/06/2024      Lab Results  Component Value Date   CHOL 289 (H) 06/16/2023   HDL 76 06/16/2023   LDLCALC 189 (H) 06/16/2023   LDLDIRECT 201.0 06/12/2022   TRIG 116 06/16/2023   CHOLHDL 3.8 06/16/2023     Orders: -     Comprehensive metabolic panel with GFR -     Microalbumin / creatinine urine ratio  Acquired hypothyroidism Assessment & Plan: Thyroid  function is WNL on  current dose of 75 mcg daily .  No current changes needed.   Lab Results  Component Value Date   TSH 1.34 05/09/2024      Mixed hyperlipidemia Assessment & Plan: Managed without statin   due to statin myalgia with trial of atorvastatin  40 mg every other day.   Sept 2023 Coronary  calcium  score was 6 (LAD) . Trial  of Zetia  was not tolerated   Lab Results  Component Value Date   CHOL 289 (H) 06/16/2023   HDL 76 06/16/2023   LDLCALC 189 (H) 06/16/2023   LDLDIRECT 201.0 06/12/2022   TRIG 116 06/16/2023   CHOLHDL 3.8 06/16/2023      Do not resuscitate status -     Do not attempt resuscitation (DNR)  Chronic pain of right knee Assessment & Plan: Improved with strengthening  exercises    Other orders -     Levothyroxine  Sodium; Take 1 tablet (75 mcg total) by mouth daily.  Dispense: 90 tablet; Refill: 3     I spent 34 minutes on the day of this face to face encounter reviewing patient's  most recent visit with  orthopedics,  prior relevant surgical and non surgical procedures, recent  labs and imaging studies, counseling on weight management,  reviewing the assessment and plan with patient, and post visit ordering and reviewing of  diagnostics and therapeutics with patient  .   Follow-up: Return in about 6 months (around 12/04/2024).   Verneita LITTIE Kettering, MD "

## 2024-06-07 ENCOUNTER — Ambulatory Visit: Payer: Self-pay | Admitting: Internal Medicine

## 2024-06-29 ENCOUNTER — Ambulatory Visit
Admission: RE | Admit: 2024-06-29 | Discharge: 2024-06-29 | Disposition: A | Source: Ambulatory Visit | Attending: Internal Medicine | Admitting: Internal Medicine

## 2024-06-29 DIAGNOSIS — Z780 Asymptomatic menopausal state: Secondary | ICD-10-CM

## 2024-06-29 DIAGNOSIS — Z1231 Encounter for screening mammogram for malignant neoplasm of breast: Secondary | ICD-10-CM

## 2024-09-13 ENCOUNTER — Other Ambulatory Visit

## 2024-12-07 ENCOUNTER — Ambulatory Visit: Admitting: Internal Medicine

## 2025-02-05 ENCOUNTER — Encounter: Admitting: Dermatology
